# Patient Record
Sex: Male | Born: 1960 | Race: Black or African American | Hispanic: No | Marital: Married | Smoking: Never smoker
Health system: Southern US, Community
[De-identification: ages and names within clinical notes are randomized; demographics above are authoritative.]

## PROBLEM LIST (undated history)

## (undated) DIAGNOSIS — S92919A Unspecified fracture of unspecified toe(s), initial encounter for closed fracture: Secondary | ICD-10-CM

## (undated) DIAGNOSIS — R51 Headache: Secondary | ICD-10-CM

## (undated) DIAGNOSIS — M549 Dorsalgia, unspecified: Secondary | ICD-10-CM

## (undated) DIAGNOSIS — M1990 Unspecified osteoarthritis, unspecified site: Secondary | ICD-10-CM

---

## 1997-06-30 HISTORY — PX: BACK SURGERY: SHX140

## 2000-03-06 ENCOUNTER — Emergency Department (HOSPITAL_COMMUNITY): Admission: EM | Admit: 2000-03-06 | Discharge: 2000-03-06 | Payer: Self-pay | Admitting: Emergency Medicine

## 2005-08-08 ENCOUNTER — Emergency Department (HOSPITAL_COMMUNITY): Admission: EM | Admit: 2005-08-08 | Discharge: 2005-08-08 | Payer: Self-pay | Admitting: Emergency Medicine

## 2005-08-16 ENCOUNTER — Ambulatory Visit (HOSPITAL_COMMUNITY): Admission: RE | Admit: 2005-08-16 | Discharge: 2005-08-16 | Payer: Self-pay | Admitting: Specialist

## 2006-11-22 ENCOUNTER — Emergency Department (HOSPITAL_COMMUNITY): Admission: EM | Admit: 2006-11-22 | Discharge: 2006-11-22 | Payer: Self-pay | Admitting: Emergency Medicine

## 2006-11-23 ENCOUNTER — Emergency Department (HOSPITAL_COMMUNITY): Admission: EM | Admit: 2006-11-23 | Discharge: 2006-11-23 | Payer: Self-pay | Admitting: Emergency Medicine

## 2007-07-07 ENCOUNTER — Ambulatory Visit (HOSPITAL_COMMUNITY): Admission: RE | Admit: 2007-07-07 | Discharge: 2007-07-08 | Payer: Self-pay | Admitting: Neurosurgery

## 2010-05-07 ENCOUNTER — Encounter (INDEPENDENT_AMBULATORY_CARE_PROVIDER_SITE_OTHER): Payer: Self-pay | Admitting: *Deleted

## 2010-07-30 NOTE — Letter (Signed)
Summary: LEC Referral (unable to schedule) Notification  [facility]  [address] [place], [place] [address]   Phone: [phone]  Fax: [phone]      May 07, 2010 [name], December 02, 1960 MRN: [id number]   [facility] [address] [place], [place]  [address]   Dear Dr. Perrin Maltese:   Thank you for your kind referral of the above patient. We have attempted to schedule the recommended COLONOSCOPY but have been unable to schedule because:  _X_ The patient was not available by phone and/or has not returned our calls.  __ The patient declined to schedule the procedure at this time.  We appreciate the referral and hope that we will have the opportunity to treat this patient in the future.    Sincerely,   [facility]  Vania Rea. Jarold Motto M.D. Hedwig Morton. Juanda Chance M.D. Venita Lick. Russella Dar M.D. Wilhemina Bonito. Marina Goodell M.D. Barbette Hair. Arlyce Dice M.D. Iva Boop M.D. Cheron Every.D.

## 2010-10-03 ENCOUNTER — Emergency Department (HOSPITAL_COMMUNITY)
Admission: EM | Admit: 2010-10-03 | Discharge: 2010-10-03 | Disposition: A | Discharge status: Home / Self Care | Payer: Federal, State, Local not specified - PPO | Attending: Emergency Medicine | Admitting: Emergency Medicine

## 2010-10-03 ENCOUNTER — Emergency Department (HOSPITAL_COMMUNITY): Payer: Federal, State, Local not specified - PPO

## 2010-10-03 DIAGNOSIS — R209 Unspecified disturbances of skin sensation: Secondary | ICD-10-CM | POA: Insufficient documentation

## 2010-10-03 DIAGNOSIS — R112 Nausea with vomiting, unspecified: Secondary | ICD-10-CM | POA: Insufficient documentation

## 2010-10-03 DIAGNOSIS — H538 Other visual disturbances: Secondary | ICD-10-CM | POA: Insufficient documentation

## 2010-10-03 DIAGNOSIS — R51 Headache: Secondary | ICD-10-CM | POA: Insufficient documentation

## 2010-10-03 MED ORDER — IOHEXOL 350 MG/ML SOLN
80.0000 mL | Freq: Once | INTRAVENOUS | Status: AC | PRN
  Administered 2010-10-03: 80 mL via INTRAVENOUS

## 2010-11-12 NOTE — Op Note (Signed)
NAME:  Rodney Wood, Rodney Wood NO.:  [id number]   MEDICAL RECORD NO.:  [id number]          PATIENT TYPE:  OIB   LOCATION:  3533                         FACILITY:  [facility]   PHYSICIAN:  Cristi Loron, M.D.DATE OF BIRTH:  September 11, 1960   DATE OF PROCEDURE:  07/07/2007  DATE OF DISCHARGE:                               OPERATIVE REPORT   BRIEF HISTORY:  The patient is a 50 year old black male who has suffered  from back and left leg pain consistent with a left L3 radiculopathy.  He  failed medical management and was worked up with a lumbar MRI, which  demonstrated that the patient had multilevel degenerative changes  including an L4-L5 spondylolisthesis, scoliosis, and a left L3-L4 far  lateral herniated disc.  I discussed the various treatment options with  the patient including surgery.  The patient has weighed the risks,  benefits, and alternatives of the surgery and decided to proceed with a  left L3-L4 far lateral microdiscectomy.   PREOPERATIVE DIAGNOSES:  Left L3-L4 herniated nucleus pulposus, L4-L5  spondylolisthesis, degenerative disc disease, lumbar radiculopathy, and  lumbago.   POSTOPERATIVE DIAGNOSES:  Left L3-L4 herniated nucleus pulposus, L4-L5  spondylolisthesis, degenerative disc disease, lumbar radiculopathy, and  lumbago.   PROCEDURE:  Left L3-L4 far lateral microdiscectomy using  microdissection.   SURGEON:  Cristi Loron, MD   ASSISTANT:  Coletta Memos, MD   ANESTHESIA:  General endotracheal.   ESTIMATED BLOOD LOSS:  50 mL.   SPECIMENS:  None.   DRAINS:  None.   COMPLICATIONS:  None.   DESCRIPTION OF PROCEDURE:  The patient was brought to the operating room  by the anesthesia team.  General endotracheal anesthesia was induced.  The patient was turned to the prone position on the Wilson frame.  His  lumbosacral region was then prepared with Betadine scrub and Betadine  solution.  Sterile drapes were applied.  I then injected the area to  be  incised with Marcaine with epinephrine solution.  I used a scalpel to  make a linear midline incision over the L3-L4 interspace.  I used  electrocautery to perform a left-sided subperiosteal dissection,  exposing the spinous process lamina of L2, L3, and L4.  We obtained  intraoperative radiograph to confirm our location.  We then inserted the  [facility] retractor for exposure and then brought the operative  microscope into the field.  Under its magnification and illumination, we  completed the microdissection/decompression.  I used a high-speed drill  to remove the lateral aspect of the left L3 pars region.  I then  dissected through the intertransverse ligament with the micro-nerve hook  and then removed the ligament with a Kerrison punch.  We then used  microdissection to dissect through the intertransverse muscle and the  adipose tissue.  We identified the exiting L3 nerve root.  We then used  microdissection to free up the nerve root, and then we palpated ventral  to the nerve root, along the exit route.  I did not encounter any large  herniated disc.  I did inspect the far lateral disc at L3-L4.  This disc  was bulging somewhat, but I did not see large holes in the annulus nor  any impending herniations/significant neural compression.  I then shrunk  the annulus down somewhat with the bipolar electrocautery.  We performed  a foraminotomy about the left L3 nerve root and then palpated along the  exit route of the L3 nerve root.  The nerve was well decompressed.  We  then obtained hemostasis using bipolar electrocautery.  We irrigated the  wound out with bacitracin solution and removed the retractor.  We then  reapproximated the patient's thoracolumbar fascia with interrupted #1  Vicryl suture, subcutaneous tissue with interrupted #2-0 Vicryl suture,  and the skin with Steri-Strips and Benzoin.  The wound was then coated  with bacitracin ointment.  A sterile dressing was applied.   The drapes  were removed.  The patient was subsequently returned to the supine  position and extubated by the anesthesia team and transported to the  post-anesthesia care unit in a stable condition.  All sponge,  instrument, and needle counts were correct at the end of this case.      Cristi Loron, M.D.  Electronically Signed     JDJ/MEDQ  D:  07/07/2007  T:  07/08/2007  Job:  [id number]

## 2011-03-20 LAB — CBC
HCT: 44.7
MCHC: 34.4
MCV: 90.3
Platelets: 246
RDW: 12.6

## 2011-03-20 LAB — BASIC METABOLIC PANEL
Calcium: 9.8
GFR calc non Af Amer: >60

## 2012-01-30 ENCOUNTER — Other Ambulatory Visit: Payer: Self-pay | Admitting: Neurosurgery

## 2012-02-05 ENCOUNTER — Encounter (HOSPITAL_COMMUNITY): Payer: Self-pay | Admitting: Pharmacy Technician

## 2012-02-09 ENCOUNTER — Encounter (HOSPITAL_COMMUNITY): Payer: Self-pay

## 2012-02-09 ENCOUNTER — Encounter (HOSPITAL_COMMUNITY)
Admission: RE | Admit: 2012-02-09 | Discharge: 2012-02-09 | Disposition: A | Discharge status: Home / Self Care | Payer: Federal, State, Local not specified - PPO | Source: Ambulatory Visit | Attending: Neurosurgery | Admitting: Neurosurgery

## 2012-02-09 LAB — CBC
Hemoglobin: 14.3 g/dL (ref 13.0–17.0)
MCH: 30.8 pg (ref 26.0–34.0)
RDW: 13 % (ref 11.5–15.5)
WBC: 5.6 10*3/uL (ref 4.0–10.5)

## 2012-02-09 LAB — TYPE AND SCREEN: ABO/RH(D): O POS

## 2012-02-09 NOTE — Pre-Procedure Instructions (Addendum)
20 Rodney Wood  02/09/2012   Your procedure is scheduled on:  02/23/12  Report to [facility] at 930 AM.  Call this number if you have problems the morning of surgery: [phone]   Remember:   Do not eat foodor drink:After Midnight.  .  Take these medicines the morning of surgery with A SIP OF WATER:none STOP etodolac 8/19   Do not wear jewelry, make-up or nail polish.  Do not wear lotions, powders, or perfumes. You may wear deodorant.  Do not shave 48 hours prior to surgery. Men may shave face and neck.  Do not bring valuables to the hospital.  Contacts, dentures or bridgework may not be worn into surgery.  Leave suitcase in the car. After surgery it may be brought to your room.  For patients admitted to the hospital, checkout time is 11:00 AM the day of discharge.   Patients discharged the day of surgery will not be allowed to drive home.  Name and phone number of your driver: Kendal Hymen [phone]  Special Instructions: CHG Shower Use Special Wash: 1/2 bottle night before surgery and 1/2 bottle morning of surgery.   Please read over the following fact sheets that you were given: Pain Booklet, Coughing and Deep Breathing, Blood Transfusion Information, MRSA Information and Surgical Site Infection Prevention

## 2012-02-09 NOTE — Progress Notes (Signed)
Office called to sign and release orders 

## 2012-02-11 NOTE — Progress Notes (Signed)
NOTIFIED DR Lovell Sheehan OFFICE OF ORDERS NEEDING SIGNED.

## 2012-02-13 NOTE — Progress Notes (Signed)
Spoke with Graciella Belton requesting that orders be signed. States that she will notify MD to sign orders when he return on Monday.

## 2012-02-15 ENCOUNTER — Emergency Department (INDEPENDENT_AMBULATORY_CARE_PROVIDER_SITE_OTHER): Payer: Federal, State, Local not specified - PPO

## 2012-02-15 ENCOUNTER — Encounter (HOSPITAL_COMMUNITY): Payer: Self-pay | Admitting: *Deleted

## 2012-02-15 ENCOUNTER — Emergency Department (HOSPITAL_COMMUNITY)
Admission: EM | Admit: 2012-02-15 | Discharge: 2012-02-15 | Disposition: A | Discharge status: Home / Self Care | Payer: Federal, State, Local not specified - PPO | Source: Home / Self Care

## 2012-02-15 DIAGNOSIS — M79676 Pain in unspecified toe(s): Secondary | ICD-10-CM

## 2012-02-15 DIAGNOSIS — S92919A Unspecified fracture of unspecified toe(s), initial encounter for closed fracture: Secondary | ICD-10-CM

## 2012-02-15 DIAGNOSIS — W19XXXA Unspecified fall, initial encounter: Secondary | ICD-10-CM

## 2012-02-15 DIAGNOSIS — M79609 Pain in unspecified limb: Secondary | ICD-10-CM

## 2012-02-15 DIAGNOSIS — S92501A Displaced unspecified fracture of right lesser toe(s), initial encounter for closed fracture: Secondary | ICD-10-CM

## 2012-02-15 HISTORY — DX: Dorsalgia, unspecified: M54.9

## 2012-02-15 MED ORDER — IBUPROFEN 600 MG PO TABS: 600.0000 mg | ORAL_TABLET | Freq: Four times a day (QID) | ORAL | Status: AC | PRN

## 2012-02-15 MED ORDER — TRAMADOL HCL 50 MG PO TABS: 50.0000 mg | ORAL_TABLET | Freq: Four times a day (QID) | ORAL | Status: AC | PRN

## 2012-02-15 NOTE — ED Notes (Signed)
Reports slipping going into shed yesterday while wearing flip-flops; states his right 4th toe "was sticking straight up and I had to pull it back into place".  C/O continued pain and swelling in right 4th toe and into distal dorsal foot.  Has been applying ice.

## 2012-02-15 NOTE — ED Provider Notes (Signed)
Medical screening examination/treatment/procedure(s) were performed by non-physician practitioner and as supervising physician I was immediately available for consultation/collaboration.  Leslee Home, M.D.   Reuben Likes, MD 02/15/12 2014

## 2012-02-15 NOTE — ED Provider Notes (Signed)
History     CSN: [id number]  Arrival date & time 02/15/12  1102   None     Chief Complaint  Patient presents with  . Foot Injury    (Consider location/radiation/quality/duration/timing/severity/associated sxs/prior treatment) Patient is a 51 y.o. male presenting with foot injury. The history is provided by the patient.  Foot Injury   Rodney Wood is a 51 y.o. male who sustained a right fourth toe injury yesterday. Mechanism of injury: walking up ramp in rain where flip flops, fell, hyperextension of fourth toe. Immediate symptoms: pain.  Patient reduced toe back into place.  Symptoms have been worse since that time.  Has taken [facility] powder with no significant change in pain relief.  No prior history of related problems.   Past Medical History  Diagnosis Date  . Back pain     scheduled for surgery  01/2012    Past Surgical History  Procedure Date  . Back surgery 99    No family history on file.  History  Substance Use Topics  . Smoking status: Never Smoker   . Smokeless tobacco: Not on file  . Alcohol Use: No      Review of Systems  Constitutional: Negative.   Respiratory: Negative.   Cardiovascular: Negative.   Musculoskeletal: Positive for joint swelling. Negative for myalgias, back pain, arthralgias and gait problem.    Allergies  Review of patient's allergies indicates no known allergies.  Home Medications   Current Outpatient Rx  Name Route Sig Dispense Refill  . ETODOLAC 400 MG PO TABS Oral Take 400 mg by mouth 2 (two) times daily.    . IBUPROFEN 600 MG PO TABS Oral Take 1 tablet (600 mg total) by mouth every 6 (six) hours as needed for pain. 30 tablet 1  . TRAMADOL HCL 50 MG PO TABS Oral Take 1 tablet (50 mg total) by mouth every 6 (six) hours as needed for pain. 15 tablet 0    BP 117/80  Pulse 80  Temp 98.4 F (36.9 C) (Oral)  Resp 18  SpO2 97%  Physical Exam  Nursing note and vitals reviewed. Constitutional: He is oriented to person, place,  and time. Vital signs are normal. He appears well-developed and well-nourished. He is active and cooperative.  HENT:  Head: Normocephalic.  Eyes: Conjunctivae are normal. Pupils are equal, round, and reactive to light. No scleral icterus.  Neck: Trachea normal. Neck supple.  Cardiovascular: Normal rate and regular rhythm.   Pulmonary/Chest: Effort normal and breath sounds normal.  Musculoskeletal:       Right ankle: Normal. Achilles tendon normal.       Left ankle: Normal. Achilles tendon normal.       Right foot: He exhibits tenderness and swelling. He exhibits normal range of motion, normal capillary refill and no crepitus.       Left foot: Normal.       Feet:       Bruising, tenderness and swelling to right fourth toe, sensation intact, cap refill <3 secs  Neurological: He is alert and oriented to person, place, and time. He has normal strength. No cranial nerve deficit or sensory deficit. Coordination and gait normal. GCS eye subscore is 4. GCS verbal subscore is 5. GCS motor subscore is 6.  Skin: Skin is warm and dry.  Psychiatric: He has a normal mood and affect. His speech is normal and behavior is normal. Judgment and thought content normal. Cognition and memory are normal.    ED Course  Procedures (including critical care time)  Labs Reviewed - No data to display Dg Foot Complete Right  02/15/2012  *RADIOLOGY REPORT*  Clinical Data: Larey Seat, injuring foot.  Pain in the fourth toe at the metatarsal phalangeal joint.  Soft tissue swelling and bruising.  RIGHT FOOT COMPLETE - 3+ VIEW  Comparison: None.  Findings: There is an oblique fracture of the proximal phalanx of the fourth digit.  This is associated with soft tissue swelling. This does not involve the articular surface of the metatarsal phalangeal joint.  The fourth metatarsal and other bones are intact.  No radiopaque foreign body or soft tissue gas.  IMPRESSION: Fracture of the fourth proximal phalanx.  Original Report  Authenticated By: Patterson Hammersmith, M.D.     1. Fracture of fourth toe, right, closed   2. Fall   3. Toe pain       MDM  Buddy tape toe in office, post op shoe provided.  Take pain medication as prescribed.         Johnsie Kindred, NP 02/15/12 1304

## 2012-02-22 MED ORDER — CEFAZOLIN SODIUM-DEXTROSE 2-3 GM-% IV SOLR
2.0000 g | INTRAVENOUS | Status: AC
  Administered 2012-02-23: 2 g via INTRAVENOUS
  Filled 2012-02-22: qty 50

## 2012-02-23 ENCOUNTER — Ambulatory Visit (HOSPITAL_COMMUNITY): Payer: Federal, State, Local not specified - PPO

## 2012-02-23 ENCOUNTER — Encounter (HOSPITAL_COMMUNITY)
Admission: RE | Disposition: A | Discharge status: Home / Self Care | Payer: Self-pay | Source: Ambulatory Visit | Attending: Neurosurgery

## 2012-02-23 ENCOUNTER — Encounter (HOSPITAL_COMMUNITY): Payer: Self-pay | Admitting: Certified Registered"

## 2012-02-23 ENCOUNTER — Inpatient Hospital Stay (HOSPITAL_COMMUNITY)
Admission: RE | Admit: 2012-02-23 | Discharge: 2012-02-26 | DRG: 758 | Disposition: A | Discharge status: Home / Self Care | Payer: Federal, State, Local not specified - PPO | Source: Ambulatory Visit | Attending: Neurosurgery | Admitting: Neurosurgery

## 2012-02-23 ENCOUNTER — Ambulatory Visit (HOSPITAL_COMMUNITY): Payer: Federal, State, Local not specified - PPO | Admitting: Certified Registered"

## 2012-02-23 ENCOUNTER — Encounter (HOSPITAL_COMMUNITY): Payer: Self-pay | Admitting: *Deleted

## 2012-02-23 DIAGNOSIS — G5620 Lesion of ulnar nerve, unspecified upper limb: Secondary | ICD-10-CM | POA: Diagnosis present

## 2012-02-23 DIAGNOSIS — M4310 Spondylolisthesis, site unspecified: Secondary | ICD-10-CM | POA: Diagnosis present

## 2012-02-23 DIAGNOSIS — D1779 Benign lipomatous neoplasm of other sites: Secondary | ICD-10-CM | POA: Diagnosis present

## 2012-02-23 DIAGNOSIS — M48062 Spinal stenosis, lumbar region with neurogenic claudication: Principal | ICD-10-CM | POA: Diagnosis present

## 2012-02-23 HISTORY — DX: Unspecified fracture of unspecified toe(s), initial encounter for closed fracture: S92.919A

## 2012-02-23 HISTORY — PX: LUMBAR LAMINECTOMY/DECOMPRESSION MICRODISCECTOMY: SHX5026

## 2012-02-23 SURGERY — LUMBAR LAMINECTOMY/DECOMPRESSION MICRODISCECTOMY 2 LEVELS
Anesthesia: General | Site: Spine Lumbar | Laterality: Bilateral | Wound class: Clean

## 2012-02-23 MED ORDER — NALOXONE HCL 0.4 MG/ML IJ SOLN: 0.4000 mg | INTRAMUSCULAR | Status: DC | PRN

## 2012-02-23 MED ORDER — DIAZEPAM 5 MG PO TABS
5.0000 mg | ORAL_TABLET | Freq: Four times a day (QID) | ORAL | Status: DC | PRN
  Administered 2012-02-24 – 2012-02-26 (×5): 5 mg via ORAL
  Filled 2012-02-23 (×5): qty 1

## 2012-02-23 MED ORDER — DOCUSATE SODIUM 100 MG PO CAPS
100.0000 mg | ORAL_CAPSULE | Freq: Two times a day (BID) | ORAL | Status: DC
  Administered 2012-02-23 – 2012-02-26 (×6): 100 mg via ORAL
  Filled 2012-02-23 (×4): qty 1

## 2012-02-23 MED ORDER — ONDANSETRON HCL 4 MG/2ML IJ SOLN: 4.0000 mg | Freq: Four times a day (QID) | INTRAMUSCULAR | Status: DC | PRN

## 2012-02-23 MED ORDER — HEMOSTATIC AGENTS (NO CHARGE) OPTIME
TOPICAL | Status: DC | PRN
  Administered 2012-02-23: 1 via TOPICAL

## 2012-02-23 MED ORDER — OXYCODONE HCL 5 MG/5ML PO SOLN: 5.0000 mg | Freq: Once | ORAL | Status: DC | PRN

## 2012-02-23 MED ORDER — CEFAZOLIN SODIUM-DEXTROSE 2-3 GM-% IV SOLR
2.0000 g | Freq: Three times a day (TID) | INTRAVENOUS | Status: AC
  Administered 2012-02-23 – 2012-02-24 (×2): 2 g via INTRAVENOUS
  Filled 2012-02-23 (×3): qty 50

## 2012-02-23 MED ORDER — HYDROMORPHONE HCL PF 1 MG/ML IJ SOLN
0.2500 mg | INTRAMUSCULAR | Status: DC | PRN
  Administered 2012-02-23 (×2): 0.5 mg via INTRAVENOUS

## 2012-02-23 MED ORDER — MIDAZOLAM HCL 5 MG/5ML IJ SOLN
INTRAMUSCULAR | Status: DC | PRN
  Administered 2012-02-23: 2 mg via INTRAVENOUS

## 2012-02-23 MED ORDER — HYDROCODONE-ACETAMINOPHEN 5-325 MG PO TABS
1.0000 | ORAL_TABLET | ORAL | Status: DC | PRN
  Administered 2012-02-25 (×3): 2 via ORAL
  Filled 2012-02-23 (×3): qty 2

## 2012-02-23 MED ORDER — DROPERIDOL 2.5 MG/ML IJ SOLN: 0.6250 mg | INTRAMUSCULAR | Status: DC | PRN

## 2012-02-23 MED ORDER — OXYCODONE-ACETAMINOPHEN 5-325 MG PO TABS
1.0000 | ORAL_TABLET | ORAL | Status: DC | PRN
  Administered 2012-02-24 – 2012-02-26 (×10): 2 via ORAL
  Filled 2012-02-23 (×10): qty 2

## 2012-02-23 MED ORDER — OXYCODONE HCL 5 MG PO TABS: 5.0000 mg | ORAL_TABLET | Freq: Once | ORAL | Status: DC | PRN

## 2012-02-23 MED ORDER — ROCURONIUM BROMIDE 100 MG/10ML IV SOLN
INTRAVENOUS | Status: DC | PRN
  Administered 2012-02-23: 50 mg via INTRAVENOUS

## 2012-02-23 MED ORDER — LIDOCAINE HCL 4 % MT SOLN
OROMUCOSAL | Status: DC | PRN
  Administered 2012-02-23: 4 mL via TOPICAL

## 2012-02-23 MED ORDER — DIPHENHYDRAMINE HCL 50 MG/ML IJ SOLN: 12.5000 mg | Freq: Four times a day (QID) | INTRAMUSCULAR | Status: DC | PRN

## 2012-02-23 MED ORDER — SUFENTANIL CITRATE 50 MCG/ML IV SOLN
INTRAVENOUS | Status: DC | PRN
  Administered 2012-02-23: 20 ug via INTRAVENOUS
  Administered 2012-02-23: 30 ug via INTRAVENOUS

## 2012-02-23 MED ORDER — NEOSTIGMINE METHYLSULFATE 1 MG/ML IJ SOLN
INTRAMUSCULAR | Status: DC | PRN
  Administered 2012-02-23: 5 mg via INTRAVENOUS

## 2012-02-23 MED ORDER — 0.9 % SODIUM CHLORIDE (POUR BTL) OPTIME
TOPICAL | Status: DC | PRN
  Administered 2012-02-23: 1000 mL

## 2012-02-23 MED ORDER — BACITRACIN ZINC 500 UNIT/GM EX OINT
TOPICAL_OINTMENT | CUTANEOUS | Status: DC | PRN
  Administered 2012-02-23: 1 via TOPICAL

## 2012-02-23 MED ORDER — ACETAMINOPHEN 650 MG RE SUPP: 650.0000 mg | RECTAL | Status: DC | PRN

## 2012-02-23 MED ORDER — SODIUM CHLORIDE 0.9 % IR SOLN
Status: DC | PRN
  Administered 2012-02-23: 13:00:00

## 2012-02-23 MED ORDER — DIPHENHYDRAMINE HCL 12.5 MG/5ML PO ELIX: 12.5000 mg | ORAL_SOLUTION | Freq: Four times a day (QID) | ORAL | Status: DC | PRN

## 2012-02-23 MED ORDER — MORPHINE SULFATE (PF) 1 MG/ML IV SOLN
INTRAVENOUS | Status: AC
  Filled 2012-02-23: qty 25

## 2012-02-23 MED ORDER — PROPOFOL 10 MG/ML IV EMUL
INTRAVENOUS | Status: DC | PRN
  Administered 2012-02-23: 200 mg via INTRAVENOUS

## 2012-02-23 MED ORDER — ETODOLAC 400 MG PO TABS
400.0000 mg | ORAL_TABLET | Freq: Two times a day (BID) | ORAL | Status: DC
  Administered 2012-02-23 – 2012-02-26 (×6): 400 mg via ORAL
  Filled 2012-02-23 (×7): qty 1

## 2012-02-23 MED ORDER — BACITRACIN 50000 UNITS IM SOLR
INTRAMUSCULAR | Status: AC
  Filled 2012-02-23: qty 1

## 2012-02-23 MED ORDER — SODIUM CHLORIDE 0.9 % IV SOLN
INTRAVENOUS | Status: AC
  Filled 2012-02-23: qty 500

## 2012-02-23 MED ORDER — ONDANSETRON HCL 4 MG/2ML IJ SOLN: 4.0000 mg | INTRAMUSCULAR | Status: DC | PRN

## 2012-02-23 MED ORDER — BUPIVACAINE-EPINEPHRINE PF 0.5-1:200000 % IJ SOLN
INTRAMUSCULAR | Status: DC | PRN
  Administered 2012-02-23: 10 mL

## 2012-02-23 MED ORDER — BUPIVACAINE LIPOSOME 1.3 % IJ SUSP
20.0000 mL | INTRAMUSCULAR | Status: AC
  Filled 2012-02-23: qty 20

## 2012-02-23 MED ORDER — SODIUM CHLORIDE 0.9 % IJ SOLN: 9.0000 mL | INTRAMUSCULAR | Status: DC | PRN

## 2012-02-23 MED ORDER — GLYCOPYRROLATE 0.2 MG/ML IJ SOLN
INTRAMUSCULAR | Status: DC | PRN
  Administered 2012-02-23: .8 mg via INTRAVENOUS

## 2012-02-23 MED ORDER — ACETAMINOPHEN 325 MG PO TABS: 650.0000 mg | ORAL_TABLET | ORAL | Status: DC | PRN

## 2012-02-23 MED ORDER — PHENOL 1.4 % MT LIQD: 1.0000 | OROMUCOSAL | Status: DC | PRN

## 2012-02-23 MED ORDER — ZOLPIDEM TARTRATE 5 MG PO TABS
5.0000 mg | ORAL_TABLET | Freq: Every evening | ORAL | Status: DC | PRN
  Administered 2012-02-24 – 2012-02-25 (×2): 5 mg via ORAL
  Filled 2012-02-23 (×2): qty 1

## 2012-02-23 MED ORDER — BUPIVACAINE LIPOSOME 1.3 % IJ SUSP: INTRAMUSCULAR | Status: DC | PRN

## 2012-02-23 MED ORDER — MORPHINE SULFATE (PF) 1 MG/ML IV SOLN
INTRAVENOUS | Status: DC
  Administered 2012-02-23: 17:00:00 via INTRAVENOUS
  Administered 2012-02-24 (×2): 4.5 mg via INTRAVENOUS

## 2012-02-23 MED ORDER — MENTHOL 3 MG MT LOZG: 1.0000 | LOZENGE | OROMUCOSAL | Status: DC | PRN

## 2012-02-23 MED ORDER — LACTATED RINGERS IV SOLN
INTRAVENOUS | Status: DC | PRN
  Administered 2012-02-23 (×2): via INTRAVENOUS

## 2012-02-23 MED ORDER — THROMBIN 5000 UNITS EX KIT
PACK | CUTANEOUS | Status: DC | PRN
  Administered 2012-02-23 (×2): 5000 [IU] via TOPICAL

## 2012-02-23 MED ORDER — HYDROMORPHONE HCL PF 1 MG/ML IJ SOLN
INTRAMUSCULAR | Status: AC
  Administered 2012-02-23: 0.5 mg via INTRAVENOUS
  Filled 2012-02-23: qty 1

## 2012-02-23 MED ORDER — ONDANSETRON HCL 4 MG/2ML IJ SOLN
INTRAMUSCULAR | Status: DC | PRN
  Administered 2012-02-23: 4 mg via INTRAVENOUS

## 2012-02-23 MED ORDER — LACTATED RINGERS IV SOLN
INTRAVENOUS | Status: DC
  Administered 2012-02-23: 19:00:00 via INTRAVENOUS

## 2012-02-23 SURGICAL SUPPLY — 59 items
APL SKNCLS STERI-STRIP NONHPOA (GAUZE/BANDAGES/DRESSINGS) ×1
BAG DECANTER FOR FLEXI CONT (MISCELLANEOUS) ×2 IMPLANT
BENZOIN TINCTURE PRP APPL 2/3 (GAUZE/BANDAGES/DRESSINGS) ×2 IMPLANT
BLADE SURG ROTATE 9660 (MISCELLANEOUS) IMPLANT
BRUSH SCRUB EZ PLAIN DRY (MISCELLANEOUS) ×2 IMPLANT
BUR ACORN 6.0 (BURR) ×2 IMPLANT
BUR MATCHSTICK NEURO 3.0 LAGG (BURR) ×2 IMPLANT
CANISTER SUCTION 2500CC (MISCELLANEOUS) ×2 IMPLANT
CLOTH BEACON ORANGE TIMEOUT ST (SAFETY) ×2 IMPLANT
CONT SPEC 4OZ CLIKSEAL STRL BL (MISCELLANEOUS) ×2 IMPLANT
DRAIN JACKSON PRATT 10MM FLAT (MISCELLANEOUS) IMPLANT
DRAPE LAPAROTOMY 100X72X124 (DRAPES) ×2 IMPLANT
DRAPE MICROSCOPE LEICA (MISCELLANEOUS) ×2 IMPLANT
DRAPE POUCH INSTRU U-SHP 10X18 (DRAPES) ×2 IMPLANT
DRAPE SURG 17X23 STRL (DRAPES) ×8 IMPLANT
ELECT BLADE 4.0 EZ CLEAN MEGAD (MISCELLANEOUS) ×4
ELECT REM PT RETURN 9FT ADLT (ELECTROSURGICAL) ×2
ELECTRODE BLDE 4.0 EZ CLN MEGD (MISCELLANEOUS) ×2 IMPLANT
ELECTRODE REM PT RTRN 9FT ADLT (ELECTROSURGICAL) ×1 IMPLANT
EVACUATOR SILICONE 100CC (DRAIN) IMPLANT
EXPAREL 1.3% IMPLANT
GAUZE SPONGE 4X4 16PLY XRAY LF (GAUZE/BANDAGES/DRESSINGS) IMPLANT
GLOVE BIO SURGEON STRL SZ8 (GLOVE) ×2 IMPLANT
GLOVE BIO SURGEON STRL SZ8.5 (GLOVE) ×2 IMPLANT
GLOVE BIOGEL PI IND STRL 7.0 (GLOVE) ×2 IMPLANT
GLOVE BIOGEL PI INDICATOR 7.0 (GLOVE) ×2
GLOVE EXAM NITRILE LRG STRL (GLOVE) IMPLANT
GLOVE EXAM NITRILE MD LF STRL (GLOVE) IMPLANT
GLOVE EXAM NITRILE XL STR (GLOVE) IMPLANT
GLOVE EXAM NITRILE XS STR PU (GLOVE) IMPLANT
GLOVE INDICATOR 8.5 STRL (GLOVE) ×2 IMPLANT
GLOVE SS BIOGEL STRL SZ 8 (GLOVE) ×1 IMPLANT
GLOVE SUPERSENSE BIOGEL SZ 8 (GLOVE) ×1
GLOVE SURG SS PI 6.5 STRL IVOR (GLOVE) ×6 IMPLANT
GOWN BRE IMP SLV AUR LG STRL (GOWN DISPOSABLE) ×4 IMPLANT
GOWN BRE IMP SLV AUR XL STRL (GOWN DISPOSABLE) ×4 IMPLANT
GOWN STRL REIN 2XL LVL4 (GOWN DISPOSABLE) IMPLANT
KIT BASIN OR (CUSTOM PROCEDURE TRAY) ×2 IMPLANT
KIT ROOM TURNOVER OR (KITS) ×2 IMPLANT
NEEDLE HYPO 21X1.5 SAFETY (NEEDLE) ×2 IMPLANT
NEEDLE HYPO 22GX1.5 SAFETY (NEEDLE) ×2 IMPLANT
NS IRRIG 1000ML POUR BTL (IV SOLUTION) ×2 IMPLANT
PACK LAMINECTOMY NEURO (CUSTOM PROCEDURE TRAY) ×2 IMPLANT
PAD ARMBOARD 7.5X6 YLW CONV (MISCELLANEOUS) ×6 IMPLANT
PATTIES SURGICAL .5 X.5 (GAUZE/BANDAGES/DRESSINGS) ×2 IMPLANT
PATTIES SURGICAL .5 X1 (DISPOSABLE) IMPLANT
PATTIES SURGICAL 1X1 (DISPOSABLE) ×2 IMPLANT
RUBBERBAND STERILE (MISCELLANEOUS) ×4 IMPLANT
SPONGE GAUZE 4X4 12PLY (GAUZE/BANDAGES/DRESSINGS) ×2 IMPLANT
SPONGE SURGIFOAM ABS GEL SZ50 (HEMOSTASIS) ×2 IMPLANT
STRIP CLOSURE SKIN 1/2X4 (GAUZE/BANDAGES/DRESSINGS) ×2 IMPLANT
SUT VIC AB 1 CT1 18XBRD ANBCTR (SUTURE) ×3 IMPLANT
SUT VIC AB 1 CT1 8-18 (SUTURE) ×6
SUT VIC AB 2-0 CP2 18 (SUTURE) ×6 IMPLANT
SYR 20CC LL (SYRINGE) ×2 IMPLANT
SYR 20ML ECCENTRIC (SYRINGE) ×2 IMPLANT
TOWEL OR 17X24 6PK STRL BLUE (TOWEL DISPOSABLE) ×2 IMPLANT
TOWEL OR 17X26 10 PK STRL BLUE (TOWEL DISPOSABLE) ×2 IMPLANT
WATER STERILE IRR 1000ML POUR (IV SOLUTION) ×2 IMPLANT

## 2012-02-23 NOTE — Transfer of Care (Signed)
Immediate Anesthesia Transfer of Care Note  Patient: Rodney Wood  Procedure(s) Performed: Procedure(s) (LRB): LUMBAR LAMINECTOMY/DECOMPRESSION MICRODISCECTOMY 2 LEVELS (Bilateral)  Patient Location: PACU  Anesthesia Type: General  Level of Consciousness: awake, alert  and patient cooperative  Airway & Oxygen Therapy: Patient Spontanous Breathing and Patient connected to face mask oxygen  Post-op Assessment: Report given to PACU RN  Post vital signs: Reviewed and stable  Complications: No apparent anesthesia complications

## 2012-02-23 NOTE — Op Note (Signed)
Brief history: The patient is a 51 year old black male who suffers from back buttocks and leg pain consistent with neurogenic claudication. He has failed medical management and was worked up with a lumbar MRI. This demonstrated patient had spinal stenosis and epidural lipomatosis at L3-4, L4-5 and L5-S1. He also had a spondylolisthesis at L4-5. I discussed the various treatment options with patient including surgery. The patient has weighed the risks, benefits, and alternatives surgery decided proceed with a decompressive lumbar laminectomy.  Preoperative diagnosis: L3-4, L4-5 and L5-S1 spinal stenosis, epidural lipomatosis, L4-5 spondylolisthesis, lumbar radiculopathy, lumbago  Postoperative diagnosis: The same  Procedure: L4 and L5 laminectomy with bilateral L3 laminotomies to decompress the bilateral L3, L4, L5, and S1 nerve roots   Surgeon: Dr. Delma Officer  Asst.: Dr. Jillyn Hidden cram  Anesthesia: Gen. endotracheal  Estimated blood loss: 200 cc  Drains: None  Complications: None  Description of procedure: The patient was brought to the operating room by the anesthesia team. General endotracheal anesthesia was induced. The patient was turned to the prone position on the Wilson frame. The patient's lumbosacral region was then prepared with Betadine scrub and Betadine solution. Sterile drapes were applied.  I then injected the area to be incised with Marcaine with epinephrine solution. I then used a scalpel to make a linear midline incision over the L3-4, L4-5 and L5-S1 intervertebral disc space. I then used electrocautery to perform a bilateral  subperiosteal dissection exposing the spinous process and lamina of L3, L4, L5 and the upper sacrum. We obtained intraoperative radiograph to confirm our location. I then inserted the [facility] retractor for exposure.   I used a high-speed drill to perform a laminotomy at L3, L4, and L5 bilaterally. I then used a Kerrison punches to complete the  laminectomy at L4 and L5 as well as to widen the laminotomy bilaterally at L3 and removed the ligamentum flavum at L3-4, L4-5 and L5-S1. We then used microdissection to free up the thecal sac and the bilateral L3, L4-L5 and S1 nerve root from the epidural tissue. We encountered quite a bit of epidural lipomatosis which removed with the pituitary forceps as well as a Kerrison punches. I then used a Kerrison punch to perform a foraminotomy at about the bilateral L3, L4, L5 and S1 nerve root. We then using the nerve root retractor to gently retract the thecal sac and the nerve roots medially, we inspected the intervertebral disc at L3-4, L4-5 and L5-S1. There was no significant disc herniations..   I then palpated along the ventral surface of the thecal sac and along exit route of the bilateral L3, L4, L5 and S1 nerve root and noted that the neural structures were well decompressed. This completed the decompression.  We then obtained hemostasis using bipolar electrocautery. We irrigated the wound out with bacitracin solution. We then removed the retractor. We then reapproximated the patient's thoracolumbar fascia with interrupted #1 Vicryl suture. We then reapproximated the patient's subcutaneous tissue with interrupted 3-0 Vicryl suture. We then reapproximated patient's skin with Steri-Strips and benzoin. The was then coated with bacitracin ointment. The drapes were removed. The patient was subsequently returned to the supine position where they were extubated by the anesthesia team. The patient was then transported to the postanesthesia care unit in stable condition. All sponge instrument and needle counts were correct at the end of this case.

## 2012-02-23 NOTE — H&P (Signed)
Subjective: The patient is a 51 year old black male who has suffered from back buttocks and leg pain consistent with neurogenic claudication. The patient has failed medical management and was worked up with a lumbar MRI. This demonstrated severe multifactorial spinal stenosis at L3-4 L4-5 and L5-S1. I discussed the various treatment options with the patient including a decompressive laminectomy. I described the surgery to him. The patient has weighed the risks, benefits and alternatives surgery and decided proceed with at L3-L5 laminectomy.   Past Medical History  Diagnosis Date  . Back pain     scheduled for surgery  01/2012  . Broken toe     broke last week    Past Surgical History  Procedure Date  . Back surgery 99    No Known Allergies  History  Substance Use Topics  . Smoking status: Never Smoker   . Smokeless tobacco: Not on file  . Alcohol Use: No    No family history on file. Prior to Admission medications   Medication Sig Start Date End Date Taking? Authorizing Provider  etodolac (LODINE) 400 MG tablet Take 400 mg by mouth 2 (two) times daily.   Yes Historical Provider, MD  ibuprofen (ADVIL,MOTRIN) 600 MG tablet Take 1 tablet (600 mg total) by mouth every 6 (six) hours as needed for pain. 02/15/12 02/25/12 Yes Johnsie Kindred, NP  traMADol (ULTRAM) 50 MG tablet Take 1 tablet (50 mg total) by mouth every 6 (six) hours as needed for pain. 02/15/12 02/25/12 Yes Johnsie Kindred, NP     Review of Systems  Positive ROS: As above  All other systems have been reviewed and were otherwise negative with the exception of those mentioned in the HPI and as above.  Objective: Vital signs in last 24 hours: Temp:  [98.5 F (36.9 C)] 98.5 F (36.9 C) (08/26 0932) Pulse Rate:  [85] 85  (08/26 0932) Resp:  [18] 18  (08/26 0932) BP: (137)/(92) 137/92 mmHg (08/26 0932) SpO2:  [94 %] 94 % (08/26 0932)  General Appearance: Alert, cooperative, no distress, appears stated age Head:  Normocephalic, without obvious abnormality, atraumatic Eyes: PERRL, conjunctiva/corneas clear, EOM's intact, fundi benign, both eyes      Ears: Normal TM's and external ear canals, both ears Throat: Lips, mucosa, and tongue normal; teeth and gums normal Neck: Supple, symmetrical, trachea midline, no adenopathy; thyroid: No enlargement/tenderness/nodules; no carotid bruit or JVD Back: Symmetric, no curvature, ROM normal, no CVA tenderness Lungs: Clear to auscultation bilaterally, respirations unlabored Heart: Regular rate and rhythm, S1 and S2 normal, no murmur, rub or gallop Abdomen: Soft, non-tender, bowel sounds active all four quadrants, no masses, no organomegaly Extremities: Extremities normal, atraumatic, no cyanosis or edema Pulses: 2+ and symmetric all extremities Skin: Skin color, texture, turgor normal, no rashes or lesions  NEUROLOGIC:   Mental status: alert and oriented, no aphasia, good attention span, Fund of knowledge/ memory ok Motor Exam - grossly normal Sensory Exam - grossly normal Reflexes: Symmetric Coordination - grossly normal Gait - grossly normal Balance - grossly normal Cranial Nerves: I: smell Not tested  II: visual acuity  OS: Normal    OD: Normal   II: visual fields Full to confrontation  II: pupils Equal, round, reactive to light  III,VII: ptosis None  III,IV,VI: extraocular muscles  Full ROM  V: mastication Normal  V: facial light touch sensation  Normal  V,VII: corneal reflex  Present  VII: facial muscle function - upper  Normal  VII: facial muscle function - lower  Normal  VIII: hearing Not tested  IX: soft palate elevation  Normal  IX,X: gag reflex Present  XI: trapezius strength  5/5  XI: sternocleidomastoid strength 5/5  XI: neck flexion strength  5/5  XII: tongue strength  Normal    Data Review Lab Results  Component Value Date   WBC 5.6 02/09/2012   HGB 14.3 02/09/2012   HCT 42.3 02/09/2012   MCV 91.2 02/09/2012   PLT 200 02/09/2012    Lab Results  Component Value Date   NA 138 07/05/2007   K 4.0 07/05/2007   CL 105 07/05/2007   CO2 26 07/05/2007   BUN 11 07/05/2007   CREATININE 0.92 07/05/2007   GLUCOSE 95 07/05/2007   No results found for this basename: INR, PROTIME    Assessment/Plan: L3-4, L4-5 and L5-S1 spinal stenosis, lumbar radiculopathy, neurogenic claudication, lumbago: I discussed situation with the patient. I reviewed his MR scan with them and pointed out the abnormalities. We have discussed the various treatment options including surgery. I described the surgical option of an L3-L5 laminectomy. I've shown surgical models. We have discussed the risks, benefits, alternatives and likelihood of achieving our goals with surgery. I have answered all the patient's questions. He has decided to proceed with surgery.   Branna Cortina D 02/23/2012 12:09 PM

## 2012-02-23 NOTE — Anesthesia Procedure Notes (Signed)
Procedure Name: Intubation Date/Time: 02/23/2012 12:41 PM Performed by: Glendora Score A Pre-anesthesia Checklist: Emergency Drugs available, Patient identified, Suction available and Patient being monitored Patient Re-evaluated:Patient Re-evaluated prior to inductionOxygen Delivery Method: Circle system utilized Preoxygenation: Pre-oxygenation with 100% oxygen Intubation Type: IV induction Ventilation: Mask ventilation without difficulty and Oral airway inserted - appropriate to patient size Laryngoscope Size: Hyacinth Meeker and 2 Grade View: Grade I Tube type: Oral Tube size: 7.5 mm Number of attempts: 1 Airway Equipment and Method: Stylet and LTA kit utilized Placement Confirmation: ETT inserted through vocal cords under direct vision,  breath sounds checked- equal and bilateral and positive ETCO2 Secured at: 22 cm Tube secured with: Tape Dental Injury: Teeth and Oropharynx as per pre-operative assessment

## 2012-02-23 NOTE — Anesthesia Postprocedure Evaluation (Signed)
  Anesthesia Post-op Note  Patient: Rodney Wood  Procedure(s) Performed: Procedure(s) (LRB): LUMBAR LAMINECTOMY/DECOMPRESSION MICRODISCECTOMY 2 LEVELS (Bilateral)  Patient Location: PACU  Anesthesia Type: General  Level of Consciousness: awake, alert  and oriented  Airway and Oxygen Therapy: Patient Spontanous Breathing and Patient connected to nasal cannula oxygen  Post-op Pain: mild  Post-op Assessment: Post-op Vital signs reviewed  Post-op Vital Signs: Reviewed  Complications: No apparent anesthesia complications

## 2012-02-23 NOTE — Anesthesia Preprocedure Evaluation (Signed)
Anesthesia Evaluation  Patient identified by MRN, date of birth, ID band Patient awake    Reviewed: Allergy & Precautions, H&P , NPO status , Patient's Chart, lab work & pertinent test results  Airway Mallampati: I TM Distance: >3 FB Neck ROM: full    Dental  (+) Teeth Intact and Dental Advidsory Given   Pulmonary neg pulmonary ROS,    Pulmonary exam normal       Cardiovascular Rhythm:regular Rate:Normal     Neuro/Psych    GI/Hepatic negative GI ROS, Neg liver ROS,   Endo/Other  negative endocrine ROS  Renal/GU negative Renal ROS     Musculoskeletal   Abdominal Normal abdominal exam  (+)   Peds  Hematology   Anesthesia Other Findings   Reproductive/Obstetrics                           Anesthesia Physical Anesthesia Plan  ASA: II  Anesthesia Plan: General   Post-op Pain Management:    Induction:   Airway Management Planned:   Additional Equipment:   Intra-op Plan:   Post-operative Plan:   Informed Consent: I have reviewed the patients History and Physical, chart, labs and discussed the procedure including the risks, benefits and alternatives for the proposed anesthesia with the patient or authorized representative who has indicated his/her understanding and acceptance.   Dental Advisory Given  Plan Discussed with: Anesthesiologist, CRNA and Surgeon  Anesthesia Plan Comments:         Anesthesia Quick Evaluation

## 2012-02-23 NOTE — Preoperative (Signed)
Beta Blockers   Reason not to administer Beta Blockers:Not Applicable 

## 2012-02-24 ENCOUNTER — Encounter (HOSPITAL_COMMUNITY): Payer: Self-pay

## 2012-02-24 LAB — CBC
Platelets: 185 10*3/uL (ref 150–400)
RBC: 4.06 MIL/uL — ABNORMAL LOW (ref 4.22–5.81)
WBC: 9 10*3/uL (ref 4.0–10.5)

## 2012-02-24 MED ORDER — MORPHINE SULFATE 2 MG/ML IJ SOLN: 2.0000 mg | INTRAMUSCULAR | Status: DC | PRN

## 2012-02-24 NOTE — Evaluation (Signed)
Occupational Therapy Evaluation Patient Details Name: Rodney Wood MRN: [id number] DOB: [birth year] Today's Date: 02/24/2012 Time: 4098-1191 OT Time Calculation (min): 20 min  OT Assessment / Plan / Recommendation Clinical Impression  PT s/p back surgery with back precautions, increased dizziness since surgery, pain and decreased activity tolerance.  Pt would benefit from cont OT to increase I with basic adls and teach pt about use of adaptive equipment.    OT Assessment  Patient needs continued OT Services    Follow Up Recommendations  No OT follow up    Barriers to Discharge None    Equipment Recommendations  3 in 1 bedside comode    Recommendations for Other Services    Frequency  Min 2X/week    Precautions / Restrictions Precautions Precautions: Back Precaution Booklet Issued: Yes (comment) Precaution Comments: Pt given back precautions handout and educated about log rolling for bed mobility. Restrictions Weight Bearing Restrictions: No Other Position/Activity Restrictions: Pt dizzy.  Move from supine to sit to stand slowly   Pertinent Vitals/Pain Pt with pain level of 8/10.  Nurse gave pt pain meds prior to therapy.    ADL  Eating/Feeding: Performed;Independent Where Assessed - Eating/Feeding: Chair Grooming: Performed;Set up Where Assessed - Grooming: Supported sitting;Other (comment) (secondary to dizziness in standing.) Upper Body Bathing: Simulated;Set up Where Assessed - Upper Body Bathing: Supported sitting Lower Body Bathing: Moderate assistance;Simulated Where Assessed - Lower Body Bathing: Supported sit to stand Upper Body Dressing: Performed;Set up Where Assessed - Upper Body Dressing: Supported sitting Lower Body Dressing: Performed;Maximal assistance Where Assessed - Lower Body Dressing: Supported sit to stand Toilet Transfer: Performed;Minimal Dentist Method: Surveyor, minerals: Environmental education officer and Hygiene: Simulated;Minimal assistance Where Assessed - Engineer, mining and Hygiene: Sit to stand from 3-in-1 or toilet Equipment Used: Rolling walker Transfers/Ambulation Related to ADLs: Pt became very dizzy when standing therefore walking was deferred.  Pt transferred with min assist. ADL Comments: Dizziness limited most adls.  Pt with more difficulty with LE adls.  Pt would benefit from AE education.    OT Diagnosis: Generalized weakness;Acute pain  OT Problem List: Decreased activity tolerance;Decreased knowledge of use of DME or AE;Pain OT Treatment Interventions: Self-care/ADL training;DME and/or AE instruction   OT Goals Acute Rehab OT Goals OT Goal Formulation: With patient/family Time For Goal Achievement: 03/09/12 Potential to Achieve Goals: Good ADL Goals Pt Will Perform Grooming: with supervision;Standing at sink ADL Goal: Grooming - Progress: Goal set today Pt Will Perform Lower Body Bathing: with supervision;Sit to stand from chair;Standing at sink;Sitting at sink;with adaptive equipment ADL Goal: Lower Body Bathing - Progress: Goal set today Pt Will Perform Lower Body Dressing: with supervision;with adaptive equipment;Sit to stand from chair ADL Goal: Lower Body Dressing - Progress: Goal set today Pt Will Perform Tub/Shower Transfer: Tub transfer;with supervision ADL Goal: Tub/Shower Transfer - Progress: Goal set today Additional ADL Goal #1: Pt will complete all aspects of toileting with 3:1 over commode with S. ADL Goal: Additional Goal #1 - Progress: Goal set today  Visit Information  Last OT Received On: 02/24/12 Assistance Needed: +1    Subjective Data  Subjective: "I just get so dizzy." Patient Stated Goal: to go home.   Prior Functioning  Vision/Perception  Home Living Lives With: Spouse Available Help at Discharge: Family;Available 24 hours/day Type of Home: House Home Access: Stairs to enter [employer] of Steps: 3 Entrance Stairs-Rails: None Home Layout: Two level;Able to live on main  level with bedroom/bathroom;Bed/bath upstairs Alternate Level Stairs-Number of Steps: 10 Alternate Level Stairs-Rails: Right Bathroom Shower/Tub: Tub/shower unit;Curtain Bathroom Toilet: Standard Home Adaptive Equipment: None Additional Comments: half bath downstairs, full bath upstairs Prior Function Level of Independence: Independent Able to Take Stairs?: Yes Driving: Yes Vocation: On disability Communication Communication: No difficulties Dominant Hand: Right   Vision - Assessment Vision Assessment: Vision not tested  Cognition  Overall Cognitive Status: Appears within functional limits for tasks assessed/performed Arousal/Alertness: Awake/alert Orientation Level: Appears intact for tasks assessed Behavior During Session: [facility] for tasks performed    Extremity/Trunk Assessment Right Upper Extremity Assessment RUE ROM/Strength/Tone: [facility] for tasks assessed RUE Sensation: Deficits RUE Sensation Deficits: Pt reports tingling "like lots of bees stinging me" in hand up to elbow that was also present prior to surgery. RUE Coordination: WFL - gross/fine motor Left Upper Extremity Assessment LUE ROM/Strength/Tone: WFL for tasks assessed LUE Sensation: WFL - Light Touch LUE Coordination: WFL - gross/fine motor Right Lower Extremity Assessment RLE ROM/Strength/Tone: WFL for tasks assessed Left Lower Extremity Assessment LLE ROM/Strength/Tone: WFL for tasks assessed Trunk Assessment Trunk Assessment: Normal   Mobility Bed Mobility Bed Mobility: Rolling Right;Supine to Sit Rolling Right: 5: Supervision Rolling Left: 4: Min assist Left Sidelying to Sit: 4: Min assist Supine to Sit: 4: Min assist Details for Bed Mobility Assistance: Pt required verbal cueing for log roll sequencing and facilitation at hip and upper trunk during rolling left to maintain log rolling  position. Transfers Transfers: Sit to Stand;Stand to Sit Sit to Stand: 4: Min assist Stand to Sit: 4: Min assist Details for Transfer Assistance: Pt required verbal cueing to not hold his breath during transfers. Pt requried facilitation at pelvis and upper trunk to come to full upright stance and verbal/tactile cueing to slow descent when stand ->sit. Pt became dizzy when sitting up EOB and again when standing but dizziness resolved after <1 minute.   Exercise    Balance Balance Balance Assessed: No  End of Session OT - End of Session Activity Tolerance: Patient limited by pain Patient left: in chair;with call bell/phone within reach;with family/visitor present Nurse Communication: Mobility status  GO     Hope Budds 02/24/2012, 1:31 PM [phone]

## 2012-02-24 NOTE — Evaluation (Signed)
Physical Therapy Evaluation Patient Details Name: Rodney Wood MRN: [id number] DOB: 1960-07-30 Today's Date: 02/24/2012 Time: 4098-1191 PT Time Calculation (min): 17 min  PT Assessment / Plan / Recommendation Clinical Impression  Pt is a 65 yp male s/p L4 and L5 laminectomy with bilateral L3 laminotomies to decompress the bilateral L3, L4, L5, and S1 nerve roots. Pt presents to physical therapy evaluation with decreased mobility secpmdary to surgery and pain. Pt will benefit from skilled physical therapy in the acute care setting to address these issues prior to discharge. Pt's evaluation limited by pt complaints of dizziness. Recommending HHPT follow up  (pending pt progress).    PT Assessment  Patient needs continued PT services    Follow Up Recommendations  Home health PT;Supervision for mobility/OOB    Barriers to Discharge Inaccessible home environment bedroom and shower on second level    Equipment Recommendations  Rolling walker with 5" wheels    Recommendations for Other Services     Frequency Min 5X/week    Precautions / Restrictions Precautions Precautions: Back Precaution Booklet Issued: Yes (comment) Precaution Comments: Pt given back precautions handout and educated about log rolling for bed mobility. Restrictions Weight Bearing Restrictions: No   Pertinent Vitals/Pain Pt c/o 10/10 pain with mobility. Nursing was notified and administered pain meds.      Mobility  Bed Mobility Bed Mobility: Rolling Left;Left Sidelying to Sit Rolling Left: 4: Min assist Left Sidelying to Sit: 4: Min assist Details for Bed Mobility Assistance: Pt required verbal cueing for log roll sequencing and facilitation at hip and upper trunk during rolling left to maintain log rolling position. Transfers Transfers: Sit to Stand;Stand to Sit Sit to Stand: 4: Min assist Stand to Sit: 4: Min assist Details for Transfer Assistance: Pt required verbal cueing to not hold his breath during  transfers. Pt requried facilitation at pelvis and upper trunk to come to full upright stance and verbal/tactile cueing to slow descent when stand ->sit. Pt became dizzy when sitting up EOB and again when standing but dizziness resolved after <1 minute. Ambulation/Gait Ambulation/Gait Assistance: 4: Min assist Ambulation Distance (Feet): 40 Feet Assistive device: Rolling walker Ambulation/Gait Assistance Details: Pt required verbal cueing for upright posture and for relaxing shoulders and bil UE. Pt required one rest break during ambulation due to dizziness and distance was limited due to pt's dizziness. Gait Pattern: Step-through pattern;Decreased stride length;Shuffle;Trunk flexed;Narrow base of support Gait velocity: decreased gait speed General Gait Details: decreased step height bil Stairs: No    Exercises     PT Diagnosis: Difficulty walking;Acute pain  PT Problem List: Decreased activity tolerance;Decreased mobility;Decreased balance;Decreased knowledge of use of DME;Decreased knowledge of precautions PT Treatment Interventions: DME instruction;Gait training;Stair training;Functional mobility training;Therapeutic activities;Therapeutic exercise;Balance training;Neuromuscular re-education;Patient/family education   PT Goals Acute Rehab PT Goals PT Goal Formulation: With patient Time For Goal Achievement: 03/02/12 Potential to Achieve Goals: Good Pt will Roll Supine to Right Side: with modified independence PT Goal: Rolling Supine to Right Side - Progress: Goal set today Pt will Roll Supine to Left Side: with modified independence PT Goal: Rolling Supine to Left Side - Progress: Goal set today Pt will go Supine/Side to Sit: with modified independence PT Goal: Supine/Side to Sit - Progress: Goal set today Pt will go Sit to Stand: with modified independence PT Goal: Sit to Stand - Progress: Goal set today Pt will go Stand to Sit: with modified independence PT Goal: Stand to Sit -  Progress: Goal set today Pt will Ambulate: >  150 feet;with modified independence;with least restrictive assistive device PT Goal: Ambulate - Progress: Goal set today Pt will Go Up / Down Stairs: Flight;with supervision;with rail(s) PT Goal: Up/Down Stairs - Progress: Goal set today Additional Goals Additional Goal #1: Pt will be able to verbalize 3/3 back precautions and demonstrate understanding dueing functional mobility. PT Goal: Additional Goal #1 - Progress: Goal set today  Visit Information  Last PT Received On: 02/24/12 Assistance Needed: +2 (for chair follow secondary to pt dizziness)    Subjective Data  Subjective: "I am sore."   Prior Functioning  Home Living Lives With: Spouse Available Help at Discharge: Family;Available 24 hours/day Type of Home: House Home Access: Stairs to enter [employer] of Steps: 3 Entrance Stairs-Rails: None Home Layout: Two level;Able to live on main level with bedroom/bathroom;Bed/bath upstairs (will sleep on the couch downstairs) Alternate Level Stairs-Number of Steps: 10 Alternate Level Stairs-Rails: Right Bathroom Shower/Tub: Engineer, manufacturing systems: Standard Home Adaptive Equipment: None Additional Comments: half bath downstairs, full bath upstairs Prior Function Level of Independence: Independent Able to Take Stairs?: Yes Driving: Yes Vocation: On disability Comments: took his time because of pain Communication Communication: No difficulties    Cognition  Overall Cognitive Status: Appears within functional limits for tasks assessed/performed Arousal/Alertness: Awake/alert Orientation Level: Appears intact for tasks assessed Behavior During Session: [facility] for tasks performed    Extremity/Trunk Assessment Right Upper Extremity Assessment RUE ROM/Strength/Tone: [facility] for tasks assessed RUE Sensation: Deficits RUE Sensation Deficits: Pt reports tingling "like lots of bees stinging me" in hand up to elbow that was  also present prior to surgery. Left Upper Extremity Assessment LUE ROM/Strength/Tone: [facility] for tasks assessed Right Lower Extremity Assessment RLE ROM/Strength/Tone: [facility] for tasks assessed Left Lower Extremity Assessment LLE ROM/Strength/Tone: [facility] for tasks assessed Trunk Assessment Trunk Assessment: Normal   Balance    End of Session PT - End of Session Equipment Utilized During Treatment: Gait belt Activity Tolerance: Treatment limited secondary to medical complications (Comment) (dizziness) Patient left: in chair;with call bell/phone within reach Nurse Communication: Mobility status;Patient requests pain meds  GP     Eirik Schueler 02/24/2012, 9:53 AM

## 2012-02-24 NOTE — Progress Notes (Signed)
Patient did walk to the bathroom early morning, patient was having tingling on Rt arm and was dizzi. Tolerated fair.

## 2012-02-24 NOTE — Evaluation (Signed)
I have read and agree with the below assessment and treatment plan.  Kalina Morabito Helen Whitlow PT, DPT Pager: [phone] 

## 2012-02-24 NOTE — Progress Notes (Signed)
Patient ID: Rodney Wood, male   DOB: Dec 25, 1960, 51 y.o.   MRN: [id number] Subjective:  The patient is alert and pleasant. He looks well.  Objective: Vital signs in last 24 hours: Temp:  [97 F (36.1 C)-98.7 F (37.1 C)] 98.7 F (37.1 C) (08/27 0030) Pulse Rate:  [67-85] 85  (08/27 0030) Resp:  [14-24] 16  (08/27 0450) BP: (129-143)/(65-92) 143/65 mmHg (08/27 0030) SpO2:  [94 %-100 %] 98 % (08/27 0450) Weight:  [88.9 kg (195 lb 15.8 oz)] 88.9 kg (195 lb 15.8 oz) (08/26 1731)  Intake/Output from previous day: 08/26 0701 - 08/27 0700 In: 1600 [I.V.:1600] Out: 410 [Urine:210; Blood:200] Intake/Output this shift:    Physical exam the patient is alert and oriented. His strength is normal his bilateral gastrocnemius and dorsiflexors.  Lab Results: No results found for this basename: WBC:2,HGB:2,HCT:2,PLT:2 in the last 72 hours BMET No results found for this basename: NA:2,K:2,CL:2,CO2:2,GLUCOSE:2,BUN:2,CREATININE:2,CALCIUM:2 in the last 72 hours  Studies/Results: Dg Lumbar Spine 1 View  02/23/2012  *RADIOLOGY REPORT*  Clinical Data: 51 year old male undergoing lumbar surgery.  LUMBAR SPINE - 1 VIEW  Comparison: [facility] lumbar radiographs 12/16/2011. Lumbar MRI 11/06/2011.  Findings: Normal lumbar segmentation on comparison.  Portable cross-table lateral intraoperative view of the lumbar spine labeled film #1 at 1325 hours.  Surgical probe directed at the L5 pedicle level.  IMPRESSION: Intraoperative localization at L5.   Original Report Authenticated By: Harley Hallmark, M.D.     Assessment/Plan: Postop day #1: The patient is doing well. I will DC his PCA pump and Hep-Lock his IV. He will likely go home tomorrow.  LOS: 1 day     Demiya Magno D 02/24/2012, 7:08 AM

## 2012-02-24 NOTE — Care Management Note (Signed)
    Page 1 of 2   02/26/2012     3:43:15 PM   CARE MANAGEMENT NOTE 02/26/2012  Patient:  Rodney Wood, Rodney Wood   Account Number:  [id number]  Date Initiated:  02/24/2012  Documentation initiated by:  Onnie Boer  Subjective/Objective Assessment:   PT WAS ADMITTED FOR A LAMIN, DECOMPRESSION AND DISCECTOMY     Action/Plan:   PROGRESSION OF CARE AND DISCHARGE PLANNING   Anticipated DC Date:  02/26/2012   Anticipated DC Plan:  HOME W HOME HEALTH SERVICES      DC Planning Services  CM consult      Choice offered to / List presented to:  C-1 Patient   DME arranged  [facility] RO[facility]  3-N-1      DME agency  [facility]     HH arranged  HH-2 PT      [facility] agency  [facility]   Status of service:  Completed, signed off Medicare Important Message given?   (If response is "NO", the following Medicare IM given date fields will be blank) Date Medicare IM given:   Date Additional Medicare IM given:    Discharge Disposition:  HOME W HOME HEALTH SERVICES  Per UR Regulation:  Reviewed for med. necessity/level of care/duration of stay  If discussed at Long Length of Stay Meetings, dates discussed:    Comments:  02/24/12 Onnie Boer, RN, BSN 1038 PT WAS ADMITTED FOR BACK SURGERY, PTA PT WAS AT HOME WITH SELF CARE.  WILL F/U WITH DC NEEDS AND RECOMMENDATIONS.

## 2012-02-25 MED ORDER — GABAPENTIN 300 MG PO CAPS
300.0000 mg | ORAL_CAPSULE | Freq: Two times a day (BID) | ORAL | Status: DC
  Administered 2012-02-25 – 2012-02-26 (×3): 300 mg via ORAL
  Filled 2012-02-25 (×4): qty 1

## 2012-02-25 NOTE — Progress Notes (Signed)
Occupational Therapy Treatment Patient Details Name: Rodney Wood MRN: [id number] DOB: [birth year] Today's Date: 02/25/2012 Time: 9562-1308 OT Time Calculation (min): 16 min  OT Assessment / Plan / Recommendation Comments on Treatment Session Focus of session on AE education.  Pt limited by pain today, but will attempt tub transfer during next session.      Follow Up Recommendations  No OT follow up    Barriers to Discharge       Equipment Recommendations  Rolling walker with 5" wheels;3 in 1 bedside comode    Recommendations for Other Services    Frequency Min 2X/week   Plan Discharge plan remains appropriate    Precautions / Restrictions Precautions Precautions: Fall;Back Precaution Booklet Issued: Yes (comment) Precaution Comments: Pt able to independently recall 3/3 back precautions. Restrictions Weight Bearing Restrictions: No Other Position/Activity Restrictions: dizzy   Pertinent Vitals/Pain See vitals    ADL  Lower Body Bathing: Simulated;Supervision/safety Where Assessed - Lower Body Bathing: Unsupported sitting Lower Body Dressing: Performed;Supervision/safety Where Assessed - Lower Body Dressing: Supported standing Equipment Used: Sock aid;Reacher;Long-handled sponge Transfers/Ambulation Related to ADLs: Pt just recently returned to bed and declining OOB ambulation. ADL Comments: Discussed with pt ADL techniques to assist in maintaining back precautions.  Pt able to cross ankles over knees with some difficulty (was able to cross legs before sx) but not quite able to reach feet, would probably be able to don pants/undergarments using this technique.  Educated pt on use of reacher, sock aid, and long handled sponge.  Pt able to return demonstration of AE with supervision and min cueing for technique.  Informed pt and wife that AE is available in hospital gift shop for $25 plus tax.  Pt appreciated AE education and reported that he is unsure yet if he will shop around  in the community for AE or purchase from gift shop.     OT Diagnosis:    OT Problem List:   OT Treatment Interventions:     OT Goals ADL Goals Pt Will Perform Grooming: with supervision;Standing at sink Pt Will Perform Lower Body Bathing: with supervision;Sit to stand from chair;Standing at sink;Sitting at sink;with adaptive equipment ADL Goal: Lower Body Bathing - Progress: Met Pt Will Perform Lower Body Dressing: with supervision;with adaptive equipment;Sit to stand from chair ADL Goal: Lower Body Dressing - Progress: Met Pt Will Perform Tub/Shower Transfer: Tub transfer;with supervision Additional ADL Goal #1: Pt will complete all aspects of toileting with 3:1 over commode with S.  Visit Information  Last OT Received On: 02/25/12 Assistance Needed: +1    Subjective Data      Prior Functioning       Cognition  Overall Cognitive Status: Appears within functional limits for tasks assessed/performed Arousal/Alertness: Awake/alert Orientation Level: Appears intact for tasks assessed Behavior During Session: [facility] for tasks performed    Mobility  Shoulder Instructions Bed Mobility Bed Mobility: Rolling Left;Left Sidelying to Sit;Sitting - Scoot to Edge of Bed;Sit to Sidelying Left Rolling Left: 5: Supervision;With rail Left Sidelying to Sit: 4: Min guard;With rails;HOB elevated Sitting - Scoot to Edge of Bed: 5: Supervision Sit to Sidelying Left: 4: Min assist;With rail;HOB elevated Details for Bed Mobility Assistance: Required increased time due to pain. Cueing for technique. Transfers Transfers: Not assessed Sit to Stand: 4: Min assist;With upper extremity assist;From bed;From chair/3-in-1 Stand to Sit: 4: Min guard;With upper extremity assist;To chair/3-in-1 Details for Transfer Assistance: cues for hand placement as well as not to hold his breath during transfers, cued for  diaphragmatic breathing and relaxation as well as faclitation for tall posture; dizziness sitt EOB  which resolved quickly, dizziness standing which resolved as well, BP WFL during all transfers       Exercises      Balance     End of Session OT - End of Session Activity Tolerance: Patient limited by pain Patient left: in bed;with call bell/phone within reach;with bed alarm set;with family/visitor present  GO    02/25/2012 Cipriano Mile OTR/L Pager [phone] Office [phone]  Cipriano Mile 02/25/2012, 2:00 PM

## 2012-02-25 NOTE — Progress Notes (Signed)
Physical Therapy Treatment Patient Details Name: Rodney Wood MRN: [id number] DOB: 1960/11/07 Today's Date: 02/25/2012 Time: 7829-5621 PT Time Calculation (min): 24 min  PT Assessment / Plan / Recommendation Comments on Treatment Session  Progressing slowly,  still limited by dizziness. Orthostatics negative.     Follow Up Recommendations  Home health PT;Supervision for mobility/OOB    Barriers to Discharge        Equipment Recommendations  Rolling walker with 5" wheels;3 in 1 bedside comode    Recommendations for Other Services    Frequency Min 5X/week   Plan Discharge plan remains appropriate;Frequency remains appropriate    Precautions / Restrictions Precautions Precautions: Fall;Back Precaution Booklet Issued: Yes (comment) Restrictions Weight Bearing Restrictions: No Other Position/Activity Restrictions: dizzy       Mobility  Bed Mobility Rolling Left: 4: Min assist Left Sidelying to Sit: 4: Min assist;HOB elevated;With rails (30 degrees) Details for Bed Mobility Assistance: cues for technique and facilitation for follow through Transfers Transfers: Sit to Stand;Stand to Sit Sit to Stand: 4: Min assist;With upper extremity assist;From bed;From chair/3-in-1 Stand to Sit: 4: Min guard;With upper extremity assist;To chair/3-in-1 Details for Transfer Assistance: cues for hand placement as well as not to hold his breath during transfers, cued for diaphragmatic breathing and relaxation as well as faclitation for tall posture; dizziness sitt EOB which resolved quickly, dizziness standing which resolved as well, BP WFL during all transfers Ambulation/Gait Ambulation/Gait Assistance: 4: Min guard Ambulation Distance (Feet): 55 Feet Assistive device: Rolling walker Ambulation/Gait Assistance Details: cues for upright posture, relaxed shoulder and trunk and for slow deep breaths during gait, again dizzy requiring one seated rest break but able to ambulate further today with  encouragement Gait Pattern: Shuffle      PT Goals Acute Rehab PT Goals PT Goal: Rolling Supine to Left Side - Progress: Progressing toward goal PT Goal: Supine/Side to Sit - Progress: Progressing toward goal PT Goal: Sit to Stand - Progress: Progressing toward goal PT Goal: Stand to Sit - Progress: Progressing toward goal PT Goal: Ambulate - Progress: Progressing toward goal Additional Goals PT Goal: Additional Goal #1 - Progress: Progressing toward goal  Visit Information  Last PT Received On: 02/25/12 Assistance Needed: +1    Subjective Data  Subjective: The only time I am not painful is when I am asleep.    Cognition  Overall Cognitive Status: Appears within functional limits for tasks assessed/performed Arousal/Alertness: Awake/alert Orientation Level: Appears intact for tasks assessed Behavior During Session: [facility] for tasks performed    Balance     End of Session PT - End of Session Equipment Utilized During Treatment: Gait belt Activity Tolerance: Patient tolerated treatment well;Other (comment) (dizziness) Patient left: in chair;with call bell/phone within reach Nurse Communication: Mobility status   GP     [facility] HELEN 02/25/2012, 10:35 AM

## 2012-02-25 NOTE — Progress Notes (Signed)
Patient ID: Rodney Wood, male   DOB: 05/24/1961, 51 y.o.   MRN: [id number] Subjective:  The patient is alert and pleasant. He complains of some right ulnar numbness. He tells me he was having his problem he prior to surgery but has worsened that since surgery. His back is sore.  Objective: Vital signs in last 24 hours: Temp:  [98.1 F (36.7 C)-98.9 F (37.2 C)] 98.3 F (36.8 C) (08/28 1039) Pulse Rate:  [70-90] 80  (08/28 1039) Resp:  [16-18] 18  (08/28 1039) BP: (115-155)/(60-84) 115/80 mmHg (08/28 1039) SpO2:  [96 %-100 %] 96 % (08/28 1039)  Intake/Output from previous day:   Intake/Output this shift:    Physical exam patient is alert and oriented. His strength is grossly normal.  Lab Results:  Basename 02/24/12 0648  WBC 9.0  HGB 12.4*  HCT 37.6*  PLT 185   BMET No results found for this basename: NA:2,K:2,CL:2,CO2:2,GLUCOSE:2,BUN:2,CREATININE:2,CALCIUM:2 in the last 72 hours  Studies/Results: No results found.  Assessment/Plan: Postop day #2: We will continue to mobilize the patient with PT and OT. He will likely go home tomorrow.  Ulnar numbness: He sounds like he has had a pre-existing ulnar neuropathy which was exacerbated by positioning for surgery this likely will improve. I discussed with the patient his wife and I will start him on Neurontin.  LOS: 2 days     Erskin Zinda D 02/25/2012, 2:19 PM

## 2012-02-26 MED ORDER — OXYCODONE-ACETAMINOPHEN 10-325 MG PO TABS: 1.0000 | ORAL_TABLET | ORAL | Status: AC | PRN

## 2012-02-26 MED ORDER — DSS 100 MG PO CAPS: 100.0000 mg | ORAL_CAPSULE | Freq: Two times a day (BID) | ORAL | Status: AC

## 2012-02-26 MED ORDER — DIAZEPAM 5 MG PO TABS: 5.0000 mg | ORAL_TABLET | Freq: Four times a day (QID) | ORAL | Status: AC | PRN

## 2012-02-26 MED ORDER — GABAPENTIN 300 MG PO CAPS: 300.0000 mg | ORAL_CAPSULE | Freq: Three times a day (TID) | ORAL | Status: DC

## 2012-02-26 NOTE — Discharge Summary (Signed)
  Physician Discharge Summary  Patient ID: Rodney Wood MRN: [id number] DOB/AGE: [birth year] 51 y.o.  Admit date: 02/23/2012 Discharge date: 02/26/2012  Admission Diagnoses: L3-4, L4-5 and L5-S1 spinal stenosis, epidural lipomatosis, lumbar radiculopathy, lumbago  Discharge Diagnoses: The same Active Problems:  * No active hospital problems. *    Discharged Condition: good  Hospital Course: I admitted the patient to [facility] on 02/23/2012. On that day I performed at L4 and L5 laminectomy with bilateral L3 laminotomies. The surgery went well. Patient's postoperative course was remarkable only for exacerbation of his pre-existing right ulnar numbness. I attributed this to positioning during surgery. We treated him with Neurontin.  By 02/26/2012 the patient was doing well and requesting discharge to home. The patient was given oral and written discharge instructions. All his questions were answered.  Consults: None Significant Diagnostic Studies: None Treatments: L4-L5 laminectomy with bilateral L3 laminotomies to decompress the bilateral L3, L4, L5 and S1 nerve roots Discharge Exam: Blood pressure 130/84, pulse 80, temperature 97.9 F (36.6 C), temperature source Oral, resp. rate 18, height 5\' 9"  (1.753 m), weight 88.9 kg (195 lb 15.8 oz), SpO2 98.00%. The patient is alert and oriented. His strength is normal. His dressing is clean and dry.  Disposition: Home  Discharge Orders    Future Orders Please Complete By Expires   Diet - low sodium heart healthy      Increase activity slowly      Discharge instructions      Comments:   Call [phone] for followup appointment.   No dressing needed      Call MD for:  temperature >100.4      Call MD for:  persistant nausea and vomiting      Call MD for:  severe uncontrolled pain      Call MD for:  redness, tenderness, or signs of infection (pain, swelling, redness, odor or green/yellow discharge around incision site)      Call MD for:  difficulty breathing, headache or visual disturbances      Call MD for:  hives      Call MD for:  persistant dizziness or light-headedness      Call MD for:  extreme fatigue        Medication List  As of 02/26/2012  1:04 PM   STOP taking these medications         ibuprofen 600 MG tablet      traMADol 50 MG tablet         TAKE these medications         diazepam 5 MG tablet   Commonly known as: VALIUM   Take 1 tablet (5 mg total) by mouth every 6 (six) hours as needed.      DSS 100 MG Caps   Take 100 mg by mouth 2 (two) times daily.      etodolac 400 MG tablet   Commonly known as: LODINE   Take 400 mg by mouth 2 (two) times daily.      gabapentin 300 MG capsule   Commonly known as: NEURONTIN   Take 1 capsule (300 mg total) by mouth 3 (three) times daily.      oxyCODONE-acetaminophen 10-325 MG per tablet   Commonly known as: PERCOCET   Take 1 tablet by mouth every 4 (four) hours as needed for pain.             SignedTressie Stalker D 02/26/2012, 1:04 PM

## 2012-02-26 NOTE — Progress Notes (Signed)
Physical Therapy Treatment Patient Details Name: Rodney Wood MRN: [id number] DOB: [birth year] Today's Date: 02/26/2012 Time: 4098-1191 PT Time Calculation (min): 21 min  PT Assessment / Plan / Recommendation Comments on Treatment Session  Moving better today still a bit behind expected progress, encouraged pt to ambulate two more times today with nursing. Only able to recite 1/3 back precautions.     Follow Up Recommendations  Home health PT;Supervision for mobility/OOB    Barriers to Discharge        Equipment Recommendations  Rolling walker with 5" wheels;3 in 1 bedside comode    Recommendations for Other Services    Frequency     Plan Discharge plan remains appropriate    Precautions / Restrictions Precautions Precautions: Back Precaution Booklet Issued: Yes (comment) Restrictions Weight Bearing Restrictions: No       Mobility  Bed Mobility Rolling Left: 4: Min assist Left Sidelying to Sit: HOB flat;5: Supervision Details for Bed Mobility Assistance: min tactile and verbal cues for sequencing and follow through to roll, supervision sidelying->sit with verbal cues Transfers Transfers: Sit to Stand;Stand to Sit Sit to Stand: 4: Min guard;With upper extremity assist Stand to Sit: With upper extremity assist;6: Modified independent (Device/Increase time) Details for Transfer Assistance: cues for hand placement and follow through for upright standing Ambulation/Gait Ambulation/Gait Assistance: 5: Supervision;4: Min guard Ambulation Distance (Feet): 90 Feet Assistive device: Rolling walker Ambulation/Gait Assistance Details: increased distance today, only one bout of dizziness but it resolved with deep breathing and relaxation techniques, pt still needing cues for relaxed shoulder posture and to not hold breath duringtransfers; encouragement for increased distance      PT Goals Acute Rehab PT Goals PT Goal: Rolling Supine to Left Side - Progress: Progressing toward  goal PT Goal: Supine/Side to Sit - Progress: Progressing toward goal PT Goal: Sit to Stand - Progress: Progressing toward goal PT Goal: Stand to Sit - Progress: Met PT Goal: Ambulate - Progress: Progressing toward goal Additional Goals PT Goal: Additional Goal #1 - Progress: Progressing toward goal  Visit Information  Last PT Received On: 02/26/12 Assistance Needed: +1    Subjective Data  Subjective: Im just sore in my back.    Cognition  Overall Cognitive Status: Appears within functional limits for tasks assessed/performed Arousal/Alertness: Awake/alert Orientation Level: Appears intact for tasks assessed Behavior During Session: [facility] for tasks performed    Balance     End of Session PT - End of Session Equipment Utilized During Treatment: Gait belt Activity Tolerance: Patient tolerated treatment well Patient left: in chair;with call bell/phone within reach Nurse Communication: Mobility status   GP     [facility] HELEN 02/26/2012, 9:36 AM

## 2012-02-26 NOTE — Progress Notes (Signed)
Occupational Therapy Treatment Patient Details Name: Rodney Wood MRN: [id number] DOB: April 21, 1961 Today's Date: 02/26/2012 Time: 6295-2841 OT Time Calculation (min): 13 min  OT Assessment / Plan / Recommendation Comments on Treatment Session Educated pt on tub transfer technique, however pt unwillling to return demonstration today.  possible d/c this PM.    Follow Up Recommendations  No OT follow up    Barriers to Discharge       Equipment Recommendations  Rolling walker with 5" wheels;3 in 1 bedside comode    Recommendations for Other Services    Frequency Min 2X/week   Plan Discharge plan remains appropriate    Precautions / Restrictions Precautions Precautions: Back   Pertinent Vitals/Pain See vitals    ADL  Tub/Shower Transfer:  (educated and demonstrated tub transfer to pt) Transfers/Ambulation Related to ADLs: pt declining OOB activity ADL Comments: Session limited today by pt refusal to perform OOB activity. Educated pt and wife on tub transfer technique using simulation in room. Pt and wife verbalized understanding.  Pt's tub is on second floor, and he reports he may wait several days before attempting shower in tub.  Wife reports that 3n1 will fit in their tub, therefore encouraged pt to use 3n1 as shower seat.    OT Diagnosis:    OT Problem List:   OT Treatment Interventions:     OT Goals ADL Goals Pt Will Perform Tub/Shower Transfer: Tub transfer;with supervision ADL Goal: Tub/Shower Transfer - Progress: Progressing toward goals  Visit Information  Last OT Received On: 02/26/12 Assistance Needed: +1    Subjective Data      Prior Functioning       Cognition  Overall Cognitive Status: Appears within functional limits for tasks assessed/performed Arousal/Alertness: Awake/alert Orientation Level: Appears intact for tasks assessed Behavior During Session: [facility] for tasks performed    Mobility  Shoulder Instructions Bed Mobility Bed Mobility: Not  assessed Transfers Transfers: Not assessed       Exercises      Balance     End of Session OT - End of Session Activity Tolerance: Patient limited by fatigue;Patient limited by pain Patient left: in bed;with call bell/phone within reach;with family/visitor present  GO    02/26/2012 Cipriano Mile OTR/L Pager [phone] Office [phone]  Cipriano Mile 02/26/2012, 3:11 PM

## 2013-05-11 ENCOUNTER — Other Ambulatory Visit: Payer: Self-pay | Admitting: Neurosurgery

## 2013-05-16 ENCOUNTER — Encounter (HOSPITAL_COMMUNITY): Payer: Self-pay | Admitting: Pharmacy Technician

## 2013-05-19 ENCOUNTER — Encounter (HOSPITAL_COMMUNITY): Payer: Self-pay

## 2013-05-19 ENCOUNTER — Encounter (HOSPITAL_COMMUNITY)
Admission: RE | Admit: 2013-05-19 | Discharge: 2013-05-19 | Disposition: A | Discharge status: Home / Self Care | Payer: Federal, State, Local not specified - PPO | Source: Ambulatory Visit | Attending: Neurosurgery | Admitting: Neurosurgery

## 2013-05-19 DIAGNOSIS — Z01812 Encounter for preprocedural laboratory examination: Secondary | ICD-10-CM | POA: Insufficient documentation

## 2013-05-19 DIAGNOSIS — Z01818 Encounter for other preprocedural examination: Secondary | ICD-10-CM | POA: Insufficient documentation

## 2013-05-19 HISTORY — DX: Headache: R51

## 2013-05-19 HISTORY — DX: Unspecified osteoarthritis, unspecified site: M19.90

## 2013-05-19 LAB — BASIC METABOLIC PANEL
BUN: 14 mg/dL (ref 6–23)
Calcium: 9.7 mg/dL (ref 8.4–10.5)
GFR calc non Af Amer: 76 mL/min — ABNORMAL LOW (ref >90)
Glucose, Bld: 115 mg/dL — ABNORMAL HIGH (ref 70–99)

## 2013-05-19 LAB — CBC
Hemoglobin: 15 g/dL (ref 13.0–17.0)
MCH: 31.3 pg (ref 26.0–34.0)
MCHC: 34.2 g/dL (ref 30.0–36.0)
MCV: 91.4 fL (ref 78.0–100.0)
Platelets: 238 10*3/uL (ref 150–400)

## 2013-05-19 LAB — TYPE AND SCREEN: ABO/RH(D): O POS

## 2013-05-19 NOTE — Pre-Procedure Instructions (Signed)
Rodney Wood  05/19/2013   Your procedure is scheduled on:  05/30/13  Report to [facility]  2 * 3 at 9 AM.  Call this number if you have problems the morning of surgery: [phone]   Remember:   Do not eat food or drink liquids after midnight.   Take these medicines the morning of surgery with A SIP OF WATER: pain medication   Do not wear jewelry, make-up or nail polish.  Do not wear lotions, powders, or perfumes. You may wear deodorant.  Do not shave 48 hours prior to surgery. Men may shave face and neck.  Do not bring valuables to the hospital.  [facility] is not responsible                  for any belongings or valuables.               Contacts, dentures or bridgework may not be worn into surgery.  Leave suitcase in the car. After surgery it may be brought to your room.  For patients admitted to the hospital, discharge time is determined by your                treatment team.               Patients discharged the day of surgery will not be allowed to drive  home.  Name and phone number of your driver: family  Special Instructions: Shower using CHG 2 nights before surgery and the night before surgery.  If you shower the day of surgery use CHG.  Use special wash - you have one bottle of CHG for all showers.  You should use approximately 1/3 of the bottle for each shower.   Please read over the following fact sheets that you were given: Pain Booklet, Coughing and Deep Breathing, Blood Transfusion Information, MRSA Information and Surgical Site Infection Prevention

## 2013-05-29 MED ORDER — CEFAZOLIN SODIUM-DEXTROSE 2-3 GM-% IV SOLR
2.0000 g | INTRAVENOUS | Status: AC
  Administered 2013-05-30: 2 g via INTRAVENOUS
  Filled 2013-05-29 (×2): qty 50

## 2013-05-30 ENCOUNTER — Inpatient Hospital Stay (HOSPITAL_COMMUNITY): Payer: Federal, State, Local not specified - PPO | Admitting: Anesthesiology

## 2013-05-30 ENCOUNTER — Inpatient Hospital Stay (HOSPITAL_COMMUNITY): Payer: Federal, State, Local not specified - PPO

## 2013-05-30 ENCOUNTER — Encounter (HOSPITAL_COMMUNITY): Payer: Self-pay | Admitting: Anesthesiology

## 2013-05-30 ENCOUNTER — Inpatient Hospital Stay (HOSPITAL_COMMUNITY)
Admission: RE | Admit: 2013-05-30 | Discharge: 2013-06-04 | DRG: 460 | Disposition: A | Discharge status: Home / Self Care | Payer: Federal, State, Local not specified - PPO | Source: Ambulatory Visit | Attending: Neurosurgery | Admitting: Neurosurgery

## 2013-05-30 ENCOUNTER — Encounter (HOSPITAL_COMMUNITY)
Admission: RE | Disposition: A | Discharge status: Home / Self Care | Payer: Self-pay | Source: Ambulatory Visit | Attending: Neurosurgery

## 2013-05-30 ENCOUNTER — Encounter (HOSPITAL_COMMUNITY): Payer: Federal, State, Local not specified - PPO | Admitting: Anesthesiology

## 2013-05-30 DIAGNOSIS — M4316 Spondylolisthesis, lumbar region: Secondary | ICD-10-CM | POA: Diagnosis present

## 2013-05-30 DIAGNOSIS — Q762 Congenital spondylolisthesis: Secondary | ICD-10-CM

## 2013-05-30 DIAGNOSIS — M51379 Other intervertebral disc degeneration, lumbosacral region without mention of lumbar back pain or lower extremity pain: Principal | ICD-10-CM | POA: Diagnosis present

## 2013-05-30 DIAGNOSIS — M5137 Other intervertebral disc degeneration, lumbosacral region: Principal | ICD-10-CM | POA: Diagnosis present

## 2013-05-30 SURGERY — POSTERIOR LUMBAR FUSION 1 LEVEL
Anesthesia: General | Site: Spine Lumbar | Wound class: Clean

## 2013-05-30 MED ORDER — HYDROCODONE-ACETAMINOPHEN 5-325 MG PO TABS
1.0000 | ORAL_TABLET | ORAL | Status: DC | PRN
  Administered 2013-06-01 – 2013-06-04 (×5): 2 via ORAL
  Filled 2013-05-30 (×5): qty 2

## 2013-05-30 MED ORDER — LACTATED RINGERS IV SOLN
INTRAVENOUS | Status: DC
  Administered 2013-05-30: 10:00:00 via INTRAVENOUS

## 2013-05-30 MED ORDER — ONDANSETRON HCL 4 MG/2ML IJ SOLN: 4.0000 mg | INTRAMUSCULAR | Status: DC | PRN

## 2013-05-30 MED ORDER — 0.9 % SODIUM CHLORIDE (POUR BTL) OPTIME
TOPICAL | Status: DC | PRN
  Administered 2013-05-30: 1000 mL

## 2013-05-30 MED ORDER — BACITRACIN ZINC 500 UNIT/GM EX OINT
TOPICAL_OINTMENT | CUTANEOUS | Status: DC | PRN
  Administered 2013-05-30: 1 via TOPICAL

## 2013-05-30 MED ORDER — ONDANSETRON HCL 4 MG/2ML IJ SOLN: 4.0000 mg | Freq: Four times a day (QID) | INTRAMUSCULAR | Status: DC | PRN

## 2013-05-30 MED ORDER — DIPHENHYDRAMINE HCL 50 MG/ML IJ SOLN: 12.5000 mg | Freq: Four times a day (QID) | INTRAMUSCULAR | Status: DC | PRN

## 2013-05-30 MED ORDER — ZOLPIDEM TARTRATE 5 MG PO TABS: 5.0000 mg | ORAL_TABLET | Freq: Every evening | ORAL | Status: DC | PRN

## 2013-05-30 MED ORDER — NEOSTIGMINE METHYLSULFATE 1 MG/ML IJ SOLN
INTRAMUSCULAR | Status: DC | PRN
  Administered 2013-05-30: 3 mg via INTRAVENOUS

## 2013-05-30 MED ORDER — ACETAMINOPHEN 650 MG RE SUPP: 650.0000 mg | RECTAL | Status: DC | PRN

## 2013-05-30 MED ORDER — ACETAMINOPHEN 325 MG PO TABS: 650.0000 mg | ORAL_TABLET | ORAL | Status: DC | PRN

## 2013-05-30 MED ORDER — FENTANYL CITRATE 0.05 MG/ML IJ SOLN
INTRAMUSCULAR | Status: DC | PRN
  Administered 2013-05-30: 50 ug via INTRAVENOUS
  Administered 2013-05-30 (×2): 25 ug via INTRAVENOUS
  Administered 2013-05-30: 50 ug via INTRAVENOUS
  Administered 2013-05-30: 100 ug via INTRAVENOUS
  Administered 2013-05-30 (×4): 50 ug via INTRAVENOUS

## 2013-05-30 MED ORDER — DIAZEPAM 5 MG PO TABS
5.0000 mg | ORAL_TABLET | Freq: Four times a day (QID) | ORAL | Status: DC | PRN
  Administered 2013-05-30 – 2013-06-04 (×9): 5 mg via ORAL
  Filled 2013-05-30 (×9): qty 1

## 2013-05-30 MED ORDER — ALUM & MAG HYDROXIDE-SIMETH 200-200-20 MG/5ML PO SUSP: 30.0000 mL | Freq: Four times a day (QID) | ORAL | Status: DC | PRN

## 2013-05-30 MED ORDER — ONDANSETRON HCL 4 MG/2ML IJ SOLN
INTRAMUSCULAR | Status: DC | PRN
  Administered 2013-05-30: 4 mg via INTRAVENOUS

## 2013-05-30 MED ORDER — MIDAZOLAM HCL 5 MG/5ML IJ SOLN
INTRAMUSCULAR | Status: DC | PRN
  Administered 2013-05-30 (×2): 1 mg via INTRAVENOUS

## 2013-05-30 MED ORDER — OXYCODONE HCL 5 MG PO TABS
ORAL_TABLET | ORAL | Status: AC
  Administered 2013-05-30: 5 mg
  Filled 2013-05-30: qty 1

## 2013-05-30 MED ORDER — BUPIVACAINE LIPOSOME 1.3 % IJ SUSP
20.0000 mL | INTRAMUSCULAR | Status: AC
  Filled 2013-05-30: qty 20

## 2013-05-30 MED ORDER — MORPHINE SULFATE 2 MG/ML IJ SOLN
1.0000 mg | INTRAMUSCULAR | Status: DC | PRN
  Administered 2013-05-30: 2 mg via INTRAVENOUS
  Filled 2013-05-30: qty 1

## 2013-05-30 MED ORDER — ROCURONIUM BROMIDE 100 MG/10ML IV SOLN
INTRAVENOUS | Status: DC | PRN
  Administered 2013-05-30: 20 mg via INTRAVENOUS
  Administered 2013-05-30: 50 mg via INTRAVENOUS
  Administered 2013-05-30: 10 mg via INTRAVENOUS
  Administered 2013-05-30: 20 mg via INTRAVENOUS

## 2013-05-30 MED ORDER — SODIUM CHLORIDE 0.9 % IJ SOLN: 9.0000 mL | INTRAMUSCULAR | Status: DC | PRN

## 2013-05-30 MED ORDER — PROPOFOL 10 MG/ML IV BOLUS
INTRAVENOUS | Status: DC | PRN
  Administered 2013-05-30: 180 mg via INTRAVENOUS

## 2013-05-30 MED ORDER — SODIUM CHLORIDE 0.9 % IR SOLN
Status: DC | PRN
  Administered 2013-05-30: 13:00:00

## 2013-05-30 MED ORDER — OXYCODONE HCL 5 MG PO TABS: 5.0000 mg | ORAL_TABLET | Freq: Once | ORAL | Status: DC | PRN

## 2013-05-30 MED ORDER — LIDOCAINE HCL (CARDIAC) 20 MG/ML IV SOLN
INTRAVENOUS | Status: DC | PRN
  Administered 2013-05-30: 100 mg via INTRAVENOUS

## 2013-05-30 MED ORDER — BUPIVACAINE-EPINEPHRINE PF 0.5-1:200000 % IJ SOLN
INTRAMUSCULAR | Status: DC | PRN
  Administered 2013-05-30: 10 mL via PERINEURAL

## 2013-05-30 MED ORDER — PHENOL 1.4 % MT LIQD: 1.0000 | OROMUCOSAL | Status: DC | PRN

## 2013-05-30 MED ORDER — LACTATED RINGERS IV SOLN
INTRAVENOUS | Status: DC
  Administered 2013-05-30: 1000 mL via INTRAVENOUS

## 2013-05-30 MED ORDER — BUPIVACAINE LIPOSOME 1.3 % IJ SUSP
INTRAMUSCULAR | Status: DC | PRN
  Administered 2013-05-30: 20 mL

## 2013-05-30 MED ORDER — MORPHINE SULFATE (PF) 1 MG/ML IV SOLN
INTRAVENOUS | Status: DC
  Administered 2013-05-30: 20:00:00 via INTRAVENOUS
  Administered 2013-05-31: 3 mg via INTRAVENOUS
  Administered 2013-05-31: 1.5 mg via INTRAVENOUS
  Filled 2013-05-30: qty 25

## 2013-05-30 MED ORDER — LACTATED RINGERS IV SOLN
INTRAVENOUS | Status: DC | PRN
  Administered 2013-05-30 (×2): via INTRAVENOUS

## 2013-05-30 MED ORDER — MENTHOL 3 MG MT LOZG: 1.0000 | LOZENGE | OROMUCOSAL | Status: DC | PRN

## 2013-05-30 MED ORDER — DIPHENHYDRAMINE HCL 12.5 MG/5ML PO ELIX: 12.5000 mg | ORAL_SOLUTION | Freq: Four times a day (QID) | ORAL | Status: DC | PRN

## 2013-05-30 MED ORDER — HYDROMORPHONE HCL PF 1 MG/ML IJ SOLN
INTRAMUSCULAR | Status: AC
  Administered 2013-05-30: 0.5 mg
  Filled 2013-05-30: qty 1

## 2013-05-30 MED ORDER — NALOXONE HCL 0.4 MG/ML IJ SOLN: 0.4000 mg | INTRAMUSCULAR | Status: DC | PRN

## 2013-05-30 MED ORDER — MIDAZOLAM HCL 2 MG/2ML IJ SOLN: 1.0000 mg | INTRAMUSCULAR | Status: DC | PRN

## 2013-05-30 MED ORDER — CEFAZOLIN SODIUM-DEXTROSE 2-3 GM-% IV SOLR
2.0000 g | Freq: Three times a day (TID) | INTRAVENOUS | Status: AC
  Administered 2013-05-30 – 2013-05-31 (×2): 2 g via INTRAVENOUS
  Filled 2013-05-30 (×2): qty 50

## 2013-05-30 MED ORDER — PROMETHAZINE HCL 25 MG/ML IJ SOLN: 6.2500 mg | INTRAMUSCULAR | Status: DC | PRN

## 2013-05-30 MED ORDER — OXYCODONE-ACETAMINOPHEN 5-325 MG PO TABS
1.0000 | ORAL_TABLET | ORAL | Status: DC | PRN
  Administered 2013-05-31 – 2013-06-04 (×15): 2 via ORAL
  Filled 2013-05-30 (×15): qty 2

## 2013-05-30 MED ORDER — INFLUENZA VAC SPLIT QUAD 0.5 ML IM SUSP
0.5000 mL | INTRAMUSCULAR | Status: AC
  Administered 2013-05-31: 0.5 mL via INTRAMUSCULAR
  Filled 2013-05-30 (×2): qty 0.5

## 2013-05-30 MED ORDER — HYDROMORPHONE HCL PF 1 MG/ML IJ SOLN
0.2500 mg | INTRAMUSCULAR | Status: DC | PRN
  Administered 2013-05-30: 0.5 mg via INTRAVENOUS

## 2013-05-30 MED ORDER — GLYCOPYRROLATE 0.2 MG/ML IJ SOLN
INTRAMUSCULAR | Status: DC | PRN
  Administered 2013-05-30: 0.6 mg via INTRAVENOUS

## 2013-05-30 MED ORDER — FENTANYL CITRATE 0.05 MG/ML IJ SOLN: 50.0000 ug | Freq: Once | INTRAMUSCULAR | Status: DC

## 2013-05-30 MED ORDER — OXYCODONE HCL 5 MG/5ML PO SOLN: 5.0000 mg | Freq: Once | ORAL | Status: DC | PRN

## 2013-05-30 MED ORDER — THROMBIN 20000 UNITS EX SOLR
CUTANEOUS | Status: DC | PRN
  Administered 2013-05-30: 13:00:00 via TOPICAL

## 2013-05-30 MED ORDER — DOCUSATE SODIUM 100 MG PO CAPS
100.0000 mg | ORAL_CAPSULE | Freq: Two times a day (BID) | ORAL | Status: DC
  Administered 2013-05-30 – 2013-06-04 (×10): 100 mg via ORAL
  Filled 2013-05-30 (×10): qty 1

## 2013-05-30 SURGICAL SUPPLY — 66 items
APL SKNCLS STERI-STRIP NONHPOA (GAUZE/BANDAGES/DRESSINGS) ×1
BAG DECANTER FOR FLEXI CONT (MISCELLANEOUS) ×2 IMPLANT
BENZOIN TINCTURE PRP APPL 2/3 (GAUZE/BANDAGES/DRESSINGS) ×2 IMPLANT
BLADE SURG ROTATE 9660 (MISCELLANEOUS) IMPLANT
BRUSH SCRUB EZ PLAIN DRY (MISCELLANEOUS) ×2 IMPLANT
BUR ACORN 6.0 (BURR) ×2 IMPLANT
BUR MATCHSTICK NEURO 3.0 LAGG (BURR) ×2 IMPLANT
CANISTER SUCT 3000ML (MISCELLANEOUS) ×2 IMPLANT
CAP REVERE LOCKING (Cap) ×8 IMPLANT
CONT SPEC 4OZ CLIKSEAL STRL BL (MISCELLANEOUS) ×2 IMPLANT
COVER BACK TABLE 24X17X13 BIG (DRAPES) IMPLANT
COVER TABLE BACK 60X90 (DRAPES) ×2 IMPLANT
DRAPE C-ARM 42X72 X-RAY (DRAPES) ×4 IMPLANT
DRAPE LAPAROTOMY 100X72X124 (DRAPES) ×2 IMPLANT
DRAPE POUCH INSTRU U-SHP 10X18 (DRAPES) ×2 IMPLANT
DRAPE PROXIMA HALF (DRAPES) ×4 IMPLANT
DRAPE SURG 17X23 STRL (DRAPES) ×8 IMPLANT
ELECT BLADE 4.0 EZ CLEAN MEGAD (MISCELLANEOUS) ×2
ELECT REM PT RETURN 9FT ADLT (ELECTROSURGICAL) ×2
ELECTRODE BLDE 4.0 EZ CLN MEGD (MISCELLANEOUS) ×1 IMPLANT
ELECTRODE REM PT RTRN 9FT ADLT (ELECTROSURGICAL) ×1 IMPLANT
EVACUATOR 1/8 PVC DRAIN (DRAIN) ×2 IMPLANT
GAUZE SPONGE 4X4 16PLY XRAY LF (GAUZE/BANDAGES/DRESSINGS) IMPLANT
GLOVE BIO SURGEON STRL SZ8.5 (GLOVE) ×4 IMPLANT
GLOVE BIOGEL PI IND STRL 7.0 (GLOVE) ×1 IMPLANT
GLOVE BIOGEL PI INDICATOR 7.0 (GLOVE) ×1
GLOVE ECLIPSE 8.5 STRL (GLOVE) ×2 IMPLANT
GLOVE EXAM NITRILE LRG STRL (GLOVE) IMPLANT
GLOVE EXAM NITRILE MD LF STRL (GLOVE) ×4 IMPLANT
GLOVE EXAM NITRILE XL STR (GLOVE) IMPLANT
GLOVE EXAM NITRILE XS STR PU (GLOVE) IMPLANT
GLOVE SS BIOGEL STRL SZ 8 (GLOVE) ×2 IMPLANT
GLOVE SUPERSENSE BIOGEL SZ 8 (GLOVE) ×2
GLOVE SURG SS PI 7.0 STRL IVOR (GLOVE) ×4 IMPLANT
GOWN BRE IMP SLV AUR LG STRL (GOWN DISPOSABLE) IMPLANT
GOWN BRE IMP SLV AUR XL STRL (GOWN DISPOSABLE) ×8 IMPLANT
GOWN STRL REIN 2XL LVL4 (GOWN DISPOSABLE) IMPLANT
KIT BASIN OR (CUSTOM PROCEDURE TRAY) ×2 IMPLANT
KIT ROOM TURNOVER OR (KITS) ×2 IMPLANT
NEEDLE HYPO 21X1.5 SAFETY (NEEDLE) ×2 IMPLANT
NEEDLE HYPO 22GX1.5 SAFETY (NEEDLE) ×2 IMPLANT
NS IRRIG 1000ML POUR BTL (IV SOLUTION) ×2 IMPLANT
PACK FOAM VITOSS 10CC (Orthopedic Implant) ×2 IMPLANT
PACK LAMINECTOMY NEURO (CUSTOM PROCEDURE TRAY) ×2 IMPLANT
PAD ARMBOARD 7.5X6 YLW CONV (MISCELLANEOUS) ×10 IMPLANT
PATTIES SURGICAL .5 X1 (DISPOSABLE) IMPLANT
PUTTY 10ML ACTIFUSE ABX (Putty) ×2 IMPLANT
ROD CURVED REVERE 6.35X50MM (Rod) ×4 IMPLANT
SCREW REVERE 6.35 6.5X55MM (Screw) ×4 IMPLANT
SCREW REVERE 6.5X50MM (Screw) ×4 IMPLANT
SPACER SUSTAIN O SM1 10X22 10M (Spacer) ×4 IMPLANT
SPONGE GAUZE 4X4 12PLY (GAUZE/BANDAGES/DRESSINGS) ×2 IMPLANT
SPONGE LAP 4X18 X RAY DECT (DISPOSABLE) IMPLANT
SPONGE NEURO XRAY DETECT 1X3 (DISPOSABLE) IMPLANT
SPONGE SURGIFOAM ABS GEL 100 (HEMOSTASIS) ×2 IMPLANT
STRIP CLOSURE SKIN 1/2X4 (GAUZE/BANDAGES/DRESSINGS) ×2 IMPLANT
SUT VIC AB 1 CT1 18XBRD ANBCTR (SUTURE) ×2 IMPLANT
SUT VIC AB 1 CT1 8-18 (SUTURE) ×4
SUT VIC AB 2-0 CP2 18 (SUTURE) ×4 IMPLANT
SYR 20CC LL (SYRINGE) ×2 IMPLANT
SYR 20ML ECCENTRIC (SYRINGE) ×2 IMPLANT
TAPE CLOTH SURG 4X10 WHT LF (GAUZE/BANDAGES/DRESSINGS) ×2 IMPLANT
TOWEL OR 17X24 6PK STRL BLUE (TOWEL DISPOSABLE) ×2 IMPLANT
TOWEL OR 17X26 10 PK STRL BLUE (TOWEL DISPOSABLE) ×2 IMPLANT
TRAY FOLEY CATH 14FRSI W/METER (CATHETERS) IMPLANT
WATER STERILE IRR 1000ML POUR (IV SOLUTION) ×2 IMPLANT

## 2013-05-30 NOTE — Progress Notes (Signed)
Patient ID: Rodney Wood, male   DOB: October 25, 1960, 52 y.o.   MRN: [id number] Subjective:  The patient is somnolent but easily arousable. He is in no apparent distress. He looks well.  Objective: Vital signs in last 24 hours: Temp:  [97.1 F (36.2 C)] 97.1 F (36.2 C) (12/01 0914) Pulse Rate:  [77] 77 (12/01 0914) Resp:  [20] 20 (12/01 0914) BP: (150)/(101) 150/101 mmHg (12/01 0914) SpO2:  [98 %] 98 % (12/01 0914)  Intake/Output from previous day:   Intake/Output this shift: Total I/O In: 1000 [I.V.:1000] Out: 650 [Urine:450; Blood:200]  Physical exam the patient is somnolent but easily arousable. He is moving his lower extremities well.  Lab Results: No results found for this basename: WBC, HGB, HCT, PLT,  in the last 72 hours BMET No results found for this basename: NA, K, CL, CO2, GLUCOSE, BUN, CREATININE, CALCIUM,  in the last 72 hours  Studies/Results: Dg Lumbar Spine 1 View  05/30/2013   CLINICAL DATA:  Lumbar disc disease.  EXAM: LUMBAR SPINE - 1 VIEW  COMPARISON:  MRI dated 08/09/2012  FINDINGS: Single lateral view of the lumbar spine demonstrates an instrument at the level of the pedicles of L4.  IMPRESSION: Instrument at the level of the pedicles of L4.   Electronically Signed   By: Geanie Cooley M.D.   On: 05/30/2013 15:28    Assessment/Plan: The patient is doing well.  LOS: 0 days     Rodney Wood D 05/30/2013, 4:06 PM

## 2013-05-30 NOTE — Anesthesia Postprocedure Evaluation (Signed)
  Anesthesia Post-op Note  Patient: Rodney Wood  Procedure(s) Performed: Procedure(s): LUMBAR FOUR-FIVE LAMINECTOMY  WITH POSTERIOR LUMBAR INTERBODY FUSION WITH INTERBODY PROSTHESIS POSTERIOR LATERAL ARTHRODESIS AND POSTERIOR NONSEGMENTAL INSTRUMENTATION (N/A)  Patient Location: PACU  Anesthesia Type:General  Level of Consciousness: awake  Airway and Oxygen Therapy: Patient Spontanous Breathing  Post-op Pain: mild  Post-op Assessment: Post-op Vital signs reviewed, Patient's Cardiovascular Status Stable, Respiratory Function Stable, Patent Airway, No signs of Nausea or vomiting and Pain level controlled  Post-op Vital Signs: Reviewed and stable  Complications: No apparent anesthesia complications

## 2013-05-30 NOTE — H&P (Signed)
Subjective: Patient is a 52 year old male who has complained of back and leg pain consistent with neurogenic claudication. He has failed medical management worked up with a lumbar MRI and lumbar x-rays. This demonstrated an L4-5 spondylolisthesis with spinal stenosis. I discussed the various treatment options with the patient including surgery. He has weighed the risks, benefits, and alternatives surgery and decided proceed with an L4-5 decompression, instrumentation, and fusion.   Past Medical History  Diagnosis Date  . Back pain     scheduled for surgery  01/2012  . Broken toe     broke last week  . Headache(784.0)   . Arthritis     Past Surgical History  Procedure Laterality Date  . Back surgery  99  . Lumbar laminectomy/decompression microdiscectomy  02/23/2012    Procedure: LUMBAR LAMINECTOMY/DECOMPRESSION MICRODISCECTOMY 2 LEVELS;  Surgeon: Cristi Loron, MD;  Location: [facility];  Service: Neurosurgery;  Laterality: Bilateral;  Lumbar three-four, four-five Laminectomy with evacuation of epidural lipomatosis    No Known Allergies  History  Substance Use Topics  . Smoking status: Never Smoker   . Smokeless tobacco: Not on file  . Alcohol Use: Yes     Comment: occ    History reviewed. No pertinent family history. Prior to Admission medications   Medication Sig Start Date End Date Taking? Authorizing Provider  oxyCODONE-acetaminophen (PERCOCET) 7.5-325 MG per tablet Take 1 tablet by mouth 2 (two) times daily as needed for pain.   Yes Historical Provider, MD     Review of Systems  Positive ROS: As above  All other systems have been reviewed and were otherwise negative with the exception of those mentioned in the HPI and as above.  Objective: Vital signs in last 24 hours: Temp:  [97.1 F (36.2 C)] 97.1 F (36.2 C) (12/01 0914) Pulse Rate:  [77] 77 (12/01 0914) Resp:  [20] 20 (12/01 0914) BP: (150)/(101) 150/101 mmHg (12/01 0914) SpO2:  [98 %] 98 % (12/01  0914)  General Appearance: Alert, cooperative, no distress, appears stated age Head: Normocephalic, without obvious abnormality, atraumatic Eyes: PERRL, conjunctiva/corneas clear, EOM's intact, fundi benign, both eyes      Ears: Normal TM's and external ear canals, both ears Throat: Lips, mucosa, and tongue normal; teeth and gums normal Neck: Supple, symmetrical, trachea midline, no adenopathy; thyroid: No enlargement/tenderness/nodules; no carotid bruit or JVD Back: Symmetric, no curvature, ROM normal, no CVA tenderness Lungs: Clear to auscultation bilaterally, respirations unlabored Heart: Regular rate and rhythm, S1 and S2 normal, no murmur, rub or gallop Abdomen: Soft, non-tender, bowel sounds active all four quadrants, no masses, no organomegaly Extremities: Extremities normal, atraumatic, no cyanosis or edema Pulses: 2+ and symmetric all extremities Skin: Skin color, texture, turgor normal, no rashes or lesions  NEUROLOGIC:   Mental status: alert and oriented, no aphasia, good attention span, Fund of knowledge/ memory ok Motor Exam - grossly normal Sensory Exam - grossly normal Reflexes:  Coordination - grossly normal Gait - grossly normal Balance - grossly normal Cranial Nerves: I: smell Not tested  II: visual acuity  OS: Normal    OD: Normal   II: visual fields Full to confrontation  II: pupils Equal, round, reactive to light  III,VII: ptosis None  III,IV,VI: extraocular muscles  Full ROM  V: mastication Normal  V: facial light touch sensation  Normal  V,VII: corneal reflex  Present  VII: facial muscle function - upper  Normal  VII: facial muscle function - lower Normal  VIII: hearing Not tested  IX: soft palate elevation  Normal  IX,X: gag reflex Present  XI: trapezius strength  5/5  XI: sternocleidomastoid strength 5/5  XI: neck flexion strength  5/5  XII: tongue strength  Normal    Data Review Lab Results  Component Value Date   WBC 6.0 05/19/2013   HGB  15.0 05/19/2013   HCT 43.8 05/19/2013   MCV 91.4 05/19/2013   PLT 238 05/19/2013   Lab Results  Component Value Date   NA 138 05/19/2013   K 3.8 05/19/2013   CL 103 05/19/2013   CO2 23 05/19/2013   BUN 14 05/19/2013   CREATININE 1.08 05/19/2013   GLUCOSE 115* 05/19/2013   No results found for this basename: INR, PROTIME    Assessment/Plan: L4-5 spondylolisthesis, spinal stenosis, lumbago, lumbar radiculopathy, neurogenic claudication: I discussed the situation with the patient. I reviewed his imaging studies with them and pointed out the abnormalities. We have discussed the various treatment options including surgery. I described the surgical treatment option of an L4-5 decompression, instrumentation, and fusion. I have shown him surgical models. We have discussed the risks, benefits, alternatives, and likelihood of achieving our goals with surgery. I have answered all the patient's questions. He has decided to proceed with surgery.   Latarshia Jersey D 05/30/2013 11:19 AM

## 2013-05-30 NOTE — Anesthesia Procedure Notes (Signed)
Procedure Name: Intubation Date/Time: 05/30/2013 12:01 PM Performed by: Whitman Hero Pre-anesthesia Checklist: Patient identified, Emergency Drugs available, Suction available and Patient being monitored Patient Re-evaluated:Patient Re-evaluated prior to inductionOxygen Delivery Method: Circle system utilized Preoxygenation: Pre-oxygenation with 100% oxygen Intubation Type: IV induction Ventilation: Mask ventilation without difficulty Laryngoscope Size: Mac and 3 Grade View: Grade I Tube type: Oral Tube size: 8.0 mm Number of attempts: 1 Airway Equipment and Method: Stylet Placement Confirmation: ETT inserted through vocal cords under direct vision,  positive ETCO2 and breath sounds checked- equal and bilateral Secured at: 23 cm Tube secured with: Tape Dental Injury: Teeth and Oropharynx as per pre-operative assessment

## 2013-05-30 NOTE — Op Note (Signed)
Brief history: The patient is a 52 year old black male who was previously undergone a L. re\re for an L4-5 laminectomy. He has had chronic back and leg pain consistent with lumbago, lumbar radiculopathy. He has failed medical management and was worked up with a lumbar MRI lumbar x-rays. This demonstrated he has a grade 1 spondylolisthesis at L4-5 with severe facet arthropathy. I discussed the various treatment options with the patient including surgery. He has weighed the risks, benefits, and alternatives surgery and decided proceed with a redo L4 laminectomy with L4-5 decompression instrumentation and fusion.  Preoperative diagnosis: L4-5 spondylolisthesis, Degenerative disc disease, spinal stenosis compressing both the L4 and the L5 nerve roots; lumbago; lumbar radiculopathy  Postoperative diagnosis: The same  Procedure: Redo L4 laminectomy and foraminotomies to decompress the bilateral L4 and L5 nerve roots(the work required to do this was in addition to the work required to do the posterior lumbar interbody fusion because of the patient's spinal stenosis, facet arthropathy. Etc. requiring a wide decompression of the nerve roots.); L4-5 posterior lumbar interbody fusion with local morselized autograft bone and Actifusebone graft extender; insertion of interbody prosthesis at L4-5 (globus peek interbody prosthesis); posterior nonsegmental instrumentation from L4 to L5 with globus titanium pedicle screws and rods; posterior lateral arthrodesis at L4-5 with local morselized autograft bone and Vitoss bone graft extender.  Surgeon: Dr. Delma Officer  Asst.: Dr. Mardelle Matte pool  Anesthesia: Gen. endotracheal  Estimated blood loss: 200 cc  Drains: One medium Hemovac  Complications: None  Description of procedure: The patient was brought to the operating room by the anesthesia team. General endotracheal anesthesia was induced. The patient was turned to the prone position on the Wilson frame. The patient's  lumbosacral region was then prepared with Betadine scrub and Betadine solution. Sterile drapes were applied.  I then injected the area to be incised with Marcaine with epinephrine solution. I then used the scalpel to make a linear midline incision over the L4-5 interspace, incising through the patient's old surgical scar. I then used electrocautery to perform a bilateral subperiosteal dissection exposing the spinous process and lamina of L3 and L5 as well as the facets at L4-5. We then obtained intraoperative radiograph to confirm our location. We then inserted the Verstrac retractor to provide exposure.  I began the decompression by using the high speed drill to widen the prior laminectomy at L4. We carefully dissected through the epidural scar tissue and expose the dura and the bilateral L4 and L5 nerve roots We then used the Kerrison punches to widen the laminectomy and removed the remaining ligamentum flavum at L4-5. We used the Kerrison punches to remove the medial facets at L4-5. We performed wide foraminotomies about the bilateral L4 and L5 nerve roots completing the decompression.  We now turned our attention to the posterior lumbar interbody fusion. I used a scalpel to incise the intervertebral disc at L4-5 bilaterally. I then performed a partial intervertebral discectomy at L4-5 bilaterally using the pituitary forceps. We prepared the vertebral endplates at L4-5 for the fusion by removing the soft tissues with the curettes. We then used the trial spacers to pick the appropriate sized interbody prosthesis. We prefilled his prosthesis with a combination of local morselized autograft bone that we obtained during the decompression as well as Actifuse bone graft extender. We inserted the prefilled prosthesis into the interspace at L4-5. There was a good snug fit of the prosthesis in the interspace. We then filled and the remainder of the intervertebral disc space with local  morselized autograft bone and  Actifuse. This completed the posterior lumbar interbody arthrodesis.  We now turned attention to the instrumentation. Under fluoroscopic guidance we cannulated the bilateral L4 and L5 pedicles with the bone probe. We then removed the bone probe. We then tapped the pedicle with a 5.5 millimeter tap. We then removed the tap. We probed inside the tapped pedicle with a ball probe to rule out cortical breaches. We then inserted a 6.5 x 50 and 55 millimeter pedicle screw into the L4 and L5 pedicles bilaterally under fluoroscopic guidance. We then palpated along the medial aspect of the pedicles to rule out cortical breaches. There were none. The nerve roots were not injured. We then connected the unilateral pedicle screws with a lordotic rod. We compressed the construct and secured the rod in place with the caps. We then tightened the caps appropriately. This completed the instrumentation from L4-5.  We now turned our attention to the posterior lateral arthrodesis at L4-5. We used the high-speed drill to decorticate the remainder of the facets, pars, transverse process at L4-5. We then applied a combination of local morselized autograft bone and Vitoss bone graft extender over these decorticated posterior lateral structures. This completed the posterior lateral arthrodesis.  We then obtained hemostasis using bipolar electrocautery. We irrigated the wound out with bacitracin solution. We inspected the thecal sac and nerve roots and noted they were well decompressed. We then removed the retractor. We placed a medium Hemovac drain in the epidural space and tunneled out through separate stab wound. We reapproximated patient's thoracolumbar fascia with interrupted #1 Vicryl suture. We reapproximated patient's subcutaneous tissue with interrupted 2-0 Vicryl suture. The reapproximated patient's skin with Steri-Strips and benzoin. The wound was then coated with bacitracin ointment. A sterile dressing was applied. The drapes  were removed. The patient was subsequently returned to the supine position where they were extubated by the anesthesia team. He was then transported to the post anesthesia care unit in stable condition. All sponge instrument and needle counts were reportedly correct at the end of this case.

## 2013-05-30 NOTE — Anesthesia Preprocedure Evaluation (Signed)
Anesthesia Evaluation  Patient identified by MRN, date of birth, ID band Patient awake    Reviewed: Allergy & Precautions, H&P , NPO status , Patient's Chart, lab work & pertinent test results  Airway Mallampati: I TM Distance: >3 FB Neck ROM: full    Dental   Pulmonary neg pulmonary ROS,  breath sounds clear to auscultation  Pulmonary exam normal       Cardiovascular Rhythm:regular Rate:Normal     Neuro/Psych  Headaches,  Neuromuscular disease    GI/Hepatic negative GI ROS, Neg liver ROS,   Endo/Other  negative endocrine ROS  Renal/GU negative Renal ROS     Musculoskeletal   Abdominal Normal abdominal exam  (+)   Peds  Hematology   Anesthesia Other Findings   Reproductive/Obstetrics                           Anesthesia Physical Anesthesia Plan  ASA: II  Anesthesia Plan: General   Post-op Pain Management:    Induction: Intravenous  Airway Management Planned: Oral ETT  Additional Equipment:   Intra-op Plan:   Post-operative Plan: Extubation in OR  Informed Consent: I have reviewed the patients History and Physical, chart, labs and discussed the procedure including the risks, benefits and alternatives for the proposed anesthesia with the patient or authorized representative who has indicated his/her understanding and acceptance.     Plan Discussed with: CRNA and Surgeon  Anesthesia Plan Comments:         Anesthesia Quick Evaluation

## 2013-05-30 NOTE — Transfer of Care (Signed)
Immediate Anesthesia Transfer of Care Note  Patient: Rodney Wood  Procedure(s) Performed: Procedure(s): LUMBAR FOUR-FIVE LAMINECTOMY  WITH POSTERIOR LUMBAR INTERBODY FUSION WITH INTERBODY PROSTHESIS POSTERIOR LATERAL ARTHRODESIS AND POSTERIOR NONSEGMENTAL INSTRUMENTATION (N/A)  Patient Location: PACU  Anesthesia Type:General  Level of Consciousness: sedated and patient cooperative  Airway & Oxygen Therapy: Patient Spontanous Breathing and Patient connected to face mask oxygen  Post-op Assessment: Report given to PACU RN and Post -op Vital signs reviewed and stable  Post vital signs: Reviewed and stable  Complications: No apparent anesthesia complications

## 2013-05-31 LAB — BASIC METABOLIC PANEL
BUN: 8 mg/dL (ref 6–23)
Chloride: 102 mEq/L (ref 96–112)
Creatinine, Ser: 0.83 mg/dL (ref 0.50–1.35)
GFR calc Af Amer: >90 mL/min (ref >90)
GFR calc non Af Amer: >90 mL/min (ref >90)
Potassium: 3.7 mEq/L (ref 3.5–5.1)

## 2013-05-31 LAB — CBC
MCHC: 32.4 g/dL (ref 30.0–36.0)
MCV: 94.7 fL (ref 78.0–100.0)
Platelets: 184 10*3/uL (ref 150–400)
RDW: 13.5 % (ref 11.5–15.5)
WBC: 7.3 10*3/uL (ref 4.0–10.5)

## 2013-05-31 MED ORDER — MORPHINE SULFATE 2 MG/ML IJ SOLN
2.0000 mg | INTRAMUSCULAR | Status: DC | PRN
  Administered 2013-05-31 – 2013-06-01 (×2): 2 mg via INTRAVENOUS
  Administered 2013-06-01: 4 mg via INTRAVENOUS
  Administered 2013-06-02 (×3): 2 mg via INTRAVENOUS
  Filled 2013-05-31: qty 1
  Filled 2013-05-31: qty 2
  Filled 2013-05-31 (×4): qty 1

## 2013-05-31 NOTE — Evaluation (Signed)
Physical Therapy Evaluation Patient Details Name: Rodney Wood MRN: [id number] DOB: [birth year] Today's Date: 05/31/2013 Time: 7829-5621 PT Time Calculation (min): 28 min  PT Assessment / Plan / Recommendation History of Present Illness  pt rpesents with L4-5 Lami and PLIF.    Clinical Impression  Pt very painful in R LE and back with mobility.  Pt ed on donning/doffing back brace and back precautions.  Will continue to follow.      PT Assessment  Patient needs continued PT services    Follow Up Recommendations  Home health PT;Supervision/Assistance - 24 hour    Does the patient have the potential to tolerate intense rehabilitation      Barriers to Discharge        Equipment Recommendations  None recommended by PT    Recommendations for Other Services     Frequency Min 5X/week    Precautions / Restrictions Precautions Precautions: Back;Fall Precaution Booklet Issued: Yes (comment) Required Braces or Orthoses: Spinal Brace Spinal Brace: Lumbar corset;Applied in sitting position Restrictions Weight Bearing Restrictions: No   Pertinent Vitals/Pain 8/10 in back and R LE.        Mobility  Bed Mobility Bed Mobility: Rolling Right;Right Sidelying to Sit;Sitting - Scoot to Edge of Bed Rolling Right: 4: Min guard Right Sidelying to Sit: 4: Min assist Sitting - Scoot to Edge of Bed: 5: Supervision Details for Bed Mobility Assistance: cues for log roll and safe technique.   Transfers Transfers: Sit to Stand;Stand to Sit Sit to Stand: 3: Mod assist;With upper extremity assist;From bed;From chair/3-in-1 Stand to Sit: 4: Min assist;With upper extremity assist;To chair/3-in-1 Details for Transfer Assistance: cues for UE use and encouragement.  pt limited by pain in R LE.   Ambulation/Gait Ambulation/Gait Assistance: 1: +2 Total assist Ambulation/Gait: Patient Percentage: 70% Ambulation Distance (Feet): 20 Feet (and 10) Assistive device: 2 person hand held  assist Ambulation/Gait Assistance Details: Bil HHA.  pt with pain in R LE with every step.   Gait Pattern: Step-to pattern;Decreased step length - left;Decreased stance time - right;Decreased stride length;Antalgic Stairs: No Wheelchair Mobility Wheelchair Mobility: No    Exercises     PT Diagnosis: Difficulty walking;Acute pain  PT Problem List: Decreased strength;Decreased activity tolerance;Decreased balance;Decreased mobility;Decreased knowledge of use of DME;Decreased knowledge of precautions;Pain;Impaired sensation PT Treatment Interventions: DME instruction;Gait training;Stair training;Functional mobility training;Therapeutic activities;Therapeutic exercise;Balance training;Neuromuscular re-education;Patient/family education     PT Goals(Current goals can be found in the care plan section) Acute Rehab PT Goals Patient Stated Goal: Move without pain PT Goal Formulation: With patient Time For Goal Achievement: 06/07/13 Potential to Achieve Goals: Good  Visit Information  Last PT Received On: 05/31/13 Assistance Needed: +2 (for safety) PT/OT/SLP Co-Evaluation/Treatment: Yes PT goals addressed during session: Mobility/safety with mobility;Balance History of Present Illness: pt rpesents with L4-5 Lami and PLIF.         Prior Functioning  Home Living Family/patient expects to be discharged to:: Private residence Living Arrangements: Spouse/significant other Available Help at Discharge: Family;Available 24 hours/day (for 2 weeks) Type of Home: House Home Access: Stairs to enter [employer] of Steps: 2 Entrance Stairs-Rails: None Home Layout: Two level;1/2 bath on main level Alternate Level Stairs-Number of Steps: flight Alternate Level Stairs-Rails: Right Home Equipment: Walker - 2 wheels;Bedside commode Prior Function Level of Independence: Independent Communication Communication: No difficulties    Cognition  Cognition Arousal/Alertness:  Awake/alert Behavior During Therapy: WFL for tasks assessed/performed Overall Cognitive Status: Within Functional Limits for tasks assessed    Extremity/Trunk  Assessment Upper Extremity Assessment Upper Extremity Assessment: Defer to OT evaluation Lower Extremity Assessment Lower Extremity Assessment: RLE deficits/detail RLE Deficits / Details: Generally weak with pain shooting down posterior of LE.  RLE: Unable to fully assess due to pain RLE Sensation: decreased light touch   Balance Balance Balance Assessed: Yes Static Standing Balance Static Standing - Balance Support: Bilateral upper extremity supported Static Standing - Level of Assistance: 4: Min assist Static Standing - Comment/# of Minutes: With Bil UE support pt able to stand at sink with MinG, however when attempting unilateral UE support, pt unable to maintain balance.    End of Session PT - End of Session Equipment Utilized During Treatment: Gait belt;Back brace Activity Tolerance: Patient limited by pain Patient left: in chair;with call bell/phone within reach;with family/visitor present Nurse Communication: Mobility status  GP     Sunny Schlein, [place] [phone] 05/31/2013, 2:05 PM

## 2013-05-31 NOTE — Progress Notes (Signed)
UR complete.  Ralonda Tartt RN, MSN 

## 2013-05-31 NOTE — Progress Notes (Signed)
Orthopedic Tech Progress Note Patient Details:  RIDLEY SCHEWE February 28, 1961 [id number] Brace delivered.  Patient ID: DEMAREON COLDWELL, male   DOB: 1961-03-11, 52 y.o.   MRN: [id number]   Orie Rout 05/31/2013, 11:08 AM

## 2013-05-31 NOTE — Evaluation (Signed)
Occupational Therapy Evaluation Patient Details Name: Rodney Wood MRN: [id number] DOB: 11-Nov-1960 Today's Date: 05/31/2013 Time: 4098-1191 OT Time Calculation (min): 27 min  OT Assessment / Plan / Recommendation History of present illness pt presents with L4-5 Lami and PLIF.     Clinical Impression   Patient is s/p PLIF L4-5 surgery resulting in functional limitations due to the deficits listed below (see OT problem list).  Patient will benefit from skilled OT acutely to increase independence and safety with ADLS to allow discharge HHOT.     OT Assessment  Patient needs continued OT Services    Follow Up Recommendations  Home health OT    Barriers to Discharge      Equipment Recommendations  None recommended by OT    Recommendations for Other Services    Frequency  Min 2X/week    Precautions / Restrictions Precautions Precautions: Back;Fall Precaution Booklet Issued: Yes (comment) Precaution Comments: handout provided Required Braces or Orthoses: Spinal Brace Spinal Brace: Lumbar corset;Applied in sitting position Restrictions Weight Bearing Restrictions: No   Pertinent Vitals/Pain Severe pain Rt LE 8 out 10    ADL  Eating/Feeding: Modified independent Where Assessed - Eating/Feeding: Bed level Grooming: Wash/dry hands;Wash/dry face;Teeth care;Min guard Where Assessed - Grooming: Unsupported sitting (sitting on 3n1 at sink level) Upper Body Dressing: Minimal assistance Where Assessed - Upper Body Dressing: Unsupported sitting (don brace and educated on proper fit) Toilet Transfer: Moderate assistance Toilet Transfer Method: Sit to stand Toilet Transfer Equipment: Raised toilet seat with arms (or 3-in-1 over toilet) Toileting - Clothing Manipulation and Hygiene: Moderate assistance Where Assessed - Toileting Clothing Manipulation and Hygiene: Sit to stand from 3-in-1 or toilet Equipment Used: Gait belt;Back brace;Other (comment) (hand held  (A)) Transfers/Ambulation Related to ADLs: Pt required bil UE hand held (A) with OT  and PT. pt heavily weight bearing into OT hands. Recommend RW next session ADL Comments: Pt supine on arrival and brace present in room. Ot called ortho tech at 8:30 AM to request brace. Pt required (A) and education on bed mobility. Pt sitting at EOB and (A) with don of brace. Pt ambulating to restroom to void bladder and sink level grooming. pt required heavily on bil UE. pt needed cues to avoid bending and twisting. Pt unable to recall 3 back precuations and teach back used during session multiple times. Wife present and states "why dont you know them yet? you need to learn this" Pt unable to static stand for oral care. Pt positioned in chair. Pt and wife educated on the need for OOB to chair for all meals.     OT Diagnosis: Generalized weakness;Acute pain  OT Problem List: Decreased strength;Decreased activity tolerance;Impaired balance (sitting and/or standing);Decreased safety awareness;Decreased knowledge of use of DME or AE;Decreased knowledge of precautions;Pain OT Treatment Interventions: Self-care/ADL training;DME and/or AE instruction;Therapeutic activities;Patient/family education;Balance training   OT Goals(Current goals can be found in the care plan section) Acute Rehab OT Goals Patient Stated Goal: Move without pain OT Goal Formulation: With patient Time For Goal Achievement: 06/14/13 Potential to Achieve Goals: Good  Visit Information  Last OT Received On: 05/31/13 Assistance Needed: +2 (for safety) PT/OT/SLP Co-Evaluation/Treatment: Yes PT goals addressed during session: Mobility/safety with mobility;Balance OT goals addressed during session: ADL's and self-care History of Present Illness: pt presents with L4-5 Lami and PLIF.         Prior Functioning     Home Living Family/patient expects to be discharged to:: Private residence Living Arrangements: Spouse/significant other Available  Help at Discharge: Family;Available 24 hours/day (for 2 weeks) Type of Home: House Home Access: Stairs to enter [employer] of Steps: 2 Entrance Stairs-Rails: None Home Layout: Two level;1/2 bath on main level Alternate Level Stairs-Number of Steps: flight Alternate Level Stairs-Rails: Right Home Equipment: Walker - 2 wheels;Bedside commode Prior Function Level of Independence: Independent Communication Communication: No difficulties Dominant Hand: Right         Vision/Perception Vision - History Baseline Vision: No visual deficits Patient Visual Report: No change from baseline   Cognition  Cognition Arousal/Alertness: Awake/alert Behavior During Therapy: WFL for tasks assessed/performed Overall Cognitive Status: Within Functional Limits for tasks assessed    Extremity/Trunk Assessment Upper Extremity Assessment Upper Extremity Assessment: Overall WFL for tasks assessed Lower Extremity Assessment Lower Extremity Assessment: Defer to PT evaluation RLE Deficits / Details: Generally weak with pain shooting down posterior of LE.  RLE: Unable to fully assess due to pain RLE Sensation: decreased light touch Cervical / Trunk Assessment Cervical / Trunk Assessment: Normal     Mobility Bed Mobility Bed Mobility: Rolling Right;Right Sidelying to Sit;Sitting - Scoot to Edge of Bed Rolling Right: 4: Min guard Right Sidelying to Sit: 4: Min assist Sitting - Scoot to Edge of Bed: 5: Supervision Details for Bed Mobility Assistance: cues for log roll and safe technique.   Transfers Transfers: Stand to Sit;Sit to Stand Sit to Stand: 3: Mod assist;With upper extremity assist;From bed;From chair/3-in-1 Stand to Sit: 4: Min assist;With upper extremity assist;To chair/3-in-1 Details for Transfer Assistance: cues for UE use and encouragement.  pt limited by pain in R LE.       Exercise     Balance Balance Balance Assessed: Yes Static Standing Balance Static Standing -  Balance Support: Bilateral upper extremity supported Static Standing - Level of Assistance: 4: Min assist Static Standing - Comment/# of Minutes: Bil UE support at sink level    End of Session OT - End of Session Activity Tolerance: Patient limited by pain Patient left: in chair;with call bell/phone within reach;with family/visitor present Nurse Communication: Mobility status;Precautions  GO     Harolyn Rutherford 05/31/2013, 2:34 PM Pager: [phone]

## 2013-05-31 NOTE — Progress Notes (Signed)
Patient ID: Rodney Wood, male   DOB: 1961/03/23, 52 y.o.   MRN: [id number] Subjective:  The patient is alert and pleasant. He looks well. His back is approximately sore.  Objective: Vital signs in last 24 hours: Temp:  [97.1 F (36.2 C)-98.6 F (37 C)] 98.6 F (37 C) (12/02 0530) Pulse Rate:  [66-93] 85 (12/02 0530) Resp:  [2-21] 18 (12/02 0530) BP: (126-150)/(69-101) 142/86 mmHg (12/02 0530) SpO2:  [97 %-100 %] 99 % (12/02 0530) FiO2 (%):  [97 %] 97 % (12/02 0332)  Intake/Output from previous day: 12/01 0701 - 12/02 0700 In: 2367.5 [P.O.:60; I.V.:1907.5; IV Piggyback:100] Out: 2780 [Urine:2400; Drains:180; Blood:200] Intake/Output this shift:    Physical exam the patient is alert and oriented. His strength is normal his lower extremities.  Lab Results:  Recent Labs  05/31/13 0545  WBC 7.3  HGB 12.1*  HCT 37.3*  PLT 184   BMET  Recent Labs  05/31/13 0545  NA 138  K 3.7  CL 102  CO2 27  GLUCOSE 114*  BUN 8  CREATININE 0.83  CALCIUM 8.6    Studies/Results: Dg Lumbar Spine 2-3 Views  05/30/2013   CLINICAL DATA:  L4-5 posterior fixation.  EXAM: DG C-ARM 1-60 MIN; LUMBAR SPINE - 2-3 VIEW  COMPARISON:  MRI 08/09/2012  FINDINGS: AP and lateral views. These demonstrate trans pedicle screw fixation at the L4-5 level. No acute hardware complication.  IMPRESSION: Intraoperative imaging of L4-5 fixation.   Electronically Signed   By: Jeronimo Greaves M.D.   On: 05/30/2013 17:17   Dg Lumbar Spine 1 View  05/30/2013   CLINICAL DATA:  Lumbar disc disease.  EXAM: LUMBAR SPINE - 1 VIEW  COMPARISON:  MRI dated 08/09/2012  FINDINGS: Single lateral view of the lumbar spine demonstrates an instrument at the level of the pedicles of L4.  IMPRESSION: Instrument at the level of the pedicles of L4.   Electronically Signed   By: Geanie Cooley M.D.   On: 05/30/2013 15:28   Dg C-arm 1-60 Min  05/30/2013   CLINICAL DATA:  L4-5 posterior fixation.  EXAM: DG C-ARM 1-60 MIN; LUMBAR SPINE - 2-3  VIEW  COMPARISON:  MRI 08/09/2012  FINDINGS: AP and lateral views. These demonstrate trans pedicle screw fixation at the L4-5 level. No acute hardware complication.  IMPRESSION: Intraoperative imaging of L4-5 fixation.   Electronically Signed   By: Jeronimo Greaves M.D.   On: 05/30/2013 17:17    Assessment/Plan: Postop day #1: The patient is doing well. We will mobilize him with PT and OT. We will discontinue his PCA, Foley catheter, Hep-Lock his IV, etc.  LOS: 1 day     Trinadee Verhagen D 05/31/2013, 7:53 AM

## 2013-06-01 MED ORDER — GABAPENTIN 600 MG PO TABS
600.0000 mg | ORAL_TABLET | Freq: Three times a day (TID) | ORAL | Status: DC
  Administered 2013-06-01 – 2013-06-04 (×9): 600 mg via ORAL
  Filled 2013-06-01 (×11): qty 1

## 2013-06-01 MED FILL — Sodium Chloride IV Soln 0.9%: INTRAVENOUS | Qty: 1000 | Status: AC

## 2013-06-01 MED FILL — Heparin Sodium (Porcine) Inj 1000 Unit/ML: INTRAMUSCULAR | Qty: 30 | Status: AC

## 2013-06-01 NOTE — Evaluation (Signed)
Occupational Therapy Evaluation Patient Details Name: Rodney Wood MRN: [id number] DOB: May 29, 1961 Today's Date: 06/01/2013 Time: 0865-7846 OT Time Calculation (min): 19 min  OT Assessment / Plan / Recommendation History of present illness pt presents with L4-5 Lami and PLIF.     Clinical Impression   Pt progressing with OT slowly and has severe pain in RT LE. Pt and wife agreeable to CIR consult at this time. Pt can not d/c home at current level of care. Pt relies heavily on RW and needs to be able to complete transfers MOD I upon d/c home.    OT Assessment       Follow Up Recommendations  CIR;Supervision/Assistance - 24 hour (unable to transfer without (A))    Barriers to Discharge      Equipment Recommendations  None recommended by OT    Recommendations for Other Services Rehab consult  Frequency  Min 2X/week    Precautions / Restrictions Precautions Precautions: Back;Fall Precaution Comments: pt recalled 2/3 back precautions.  Reviewed precautions.   Required Braces or Orthoses: Spinal Brace Spinal Brace: Lumbar corset;Applied in sitting position Restrictions Weight Bearing Restrictions: No   Pertinent Vitals/Pain Severe RT LE pain Pt sweating due to pain / strength required to use BIL UE on RW    ADL  Grooming: Teeth care;Set up Where Assessed - Grooming: Unsupported sitting (required sitting position due to pain) Lower Body Dressing: +1 Total assistance Where Assessed - Lower Body Dressing: Supported sit to stand Toilet Transfer: Moderate assistance Toilet Transfer Method: Sit to stand Toilet Transfer Equipment: Raised toilet seat with arms (or 3-in-1 over toilet) Toileting - Clothing Manipulation and Hygiene: Minimal assistance Where Assessed - Toileting Clothing Manipulation and Hygiene: Sit to stand from 3-in-1 or toilet Equipment Used: Gait belt;Back brace;Rolling walker Transfers/Ambulation Related to ADLs: Pt requires Min (A) and relies heavily on RW for  support. Pt pushing down on RW to the point that his knuckles are white. Pt could not complete transfer without RW ADL Comments: Pt completing bed mobility this session with use of Rail. pt attempted static standing for oral care. Pt is unable to tolerate static standing position. Pt requires sitting for all adls. Pt progressing slowly with severe RT LE pain. Pt does not feel he can d/c home at this level safely. Pt limited by pain at this time but continues to work hard with therapy.    OT Diagnosis:    OT Problem List:   OT Treatment Interventions:     OT Goals(Current goals can be found in the care plan section) Acute Rehab OT Goals Patient Stated Goal: Move without pain OT Goal Formulation: With patient Time For Goal Achievement: 06/14/13 Potential to Achieve Goals: Good ADL Goals Pt Will Perform Grooming: with modified independence;standing Pt Will Perform Upper Body Bathing: with supervision;standing Pt Will Perform Lower Body Bathing: with supervision;sit to/from stand;with adaptive equipment Pt Will Perform Upper Body Dressing: with supervision;with adaptive equipment;standing Pt Will Perform Lower Body Dressing: with supervision;with adaptive equipment;sit to/from stand Pt Will Transfer to Toilet: with supervision;bedside commode Additional ADL Goal #1: Pt will complete bed mobilty MOD I without bed rails  Visit Information  Last OT Received On: 06/01/13 Assistance Needed: +1 History of Present Illness: pt presents with L4-5 Lami and PLIF.         Prior Functioning               Vision/Perception     Cognition  Cognition Arousal/Alertness: Awake/alert Behavior During Therapy: WFL for tasks  assessed/performed Overall Cognitive Status: Within Functional Limits for tasks assessed    Extremity/Trunk Assessment       Mobility Bed Mobility Bed Mobility: Rolling Right;Right Sidelying to Sit;Supine to Sit;Sitting - Scoot to Edge of Bed Rolling Right: 4: Min  guard;With rail Right Sidelying to Sit: 4: Min guard;With rails;HOB elevated Supine to Sit: 4: Min guard;With rails;HOB elevated Sitting - Scoot to Edge of Bed: 4: Min guard;With rail Sit to Sidelying Right: 5: Supervision;HOB elevated Details for Bed Mobility Assistance: cues for safety Transfers Transfers: Sit to Stand;Stand to Sit Sit to Stand: 4: Min assist;With upper extremity assist;From bed Stand to Sit: 4: Min assist;With upper extremity assist;To chair/3-in-1 Details for Transfer Assistance: cues for hand placement and safety     Exercise     Balance Balance Balance Assessed: No   End of Session OT - End of Session Activity Tolerance: Patient limited by pain Patient left: in chair;with call bell/phone within reach;with family/visitor present Nurse Communication: Mobility status;Precautions  GO     Boone Master B 06/01/2013, 11:14 AM Pager: [phone]

## 2013-06-01 NOTE — Plan of Care (Signed)
Problem: Consults Goal: Diagnosis - Spinal Surgery Thoraco/Lumbar Spine Fusion     

## 2013-06-01 NOTE — Progress Notes (Signed)
Patient ID: Rodney Wood, male   DOB: 29-Nov-1960, 52 y.o.   MRN: [id number] Subjective:  The patient is alert and pleasant. His back is approximately sore. He complains of some right leg pain when he stands.  Objective: Vital signs in last 24 hours: Temp:  [98.6 F (37 C)-99.4 F (37.4 C)] 99 F (37.2 C) (12/03 1426) Pulse Rate:  [87-100] 95 (12/03 1426) Resp:  [18-20] 20 (12/03 1426) BP: (131-151)/(80-90) 151/85 mmHg (12/03 1426) SpO2:  [95 %-98 %] 97 % (12/03 1426) Weight:  [98.249 kg (216 lb 9.6 oz)] 98.249 kg (216 lb 9.6 oz) (12/03 1236)  Intake/Output from previous day: 12/02 0701 - 12/03 0700 In: 540 [P.O.:540] Out: -  Intake/Output this shift: Total I/O In: 720 [P.O.:720] Out: -   Physical exam the patient is alert and oriented. His strength is normal in his bilateral quadriceps gastrocnemius and extensor hallucis longus. Examination of the lower extremity demonstrates no swelling, edema, deformity, etc.  Lab Results:  Recent Labs  05/31/13 0545  WBC 7.3  HGB 12.1*  HCT 37.3*  PLT 184   BMET  Recent Labs  05/31/13 0545  NA 138  K 3.7  CL 102  CO2 27  GLUCOSE 114*  BUN 8  CREATININE 0.83  CALCIUM 8.6    Studies/Results: No results found.  Assessment/Plan: Postop day #2: We will continue to mobilize the patient with PT and OT. I'll add Neurontin for his radicular pain. This should resolve with time.  LOS: 2 days     Kamerin Grumbine D 06/01/2013, 4:23 PM

## 2013-06-01 NOTE — Progress Notes (Addendum)
Physical Therapy Treatment Patient Details Name: Rodney Wood MRN: [id number] DOB: 01-24-1961 Today's Date: 06/01/2013 Time: 4098-1191 PT Time Calculation (min): 18 min  PT Assessment / Plan / Recommendation  History of Present Illness pt presents with L4-5 Lami and PLIF.     PT Comments   Pt continues to be very painful, which limits mobility.  Pt able to increase ambulation distance today and decrease amount of A needed for mobility.  At this point pt feels he may need more rehab prior to return to home.  Would benefit from CIR at D/C to maximize Independence prior to return to home.  Will continue to follow.    Follow Up Recommendations  CIR     Does the patient have the potential to tolerate intense rehabilitation     Barriers to Discharge        Equipment Recommendations  None recommended by PT    Recommendations for Other Services    Frequency Min 5X/week   Progress towards PT Goals Progress towards PT goals: Progressing toward goals  Plan Discharge plan needs to be updated    Precautions / Restrictions Precautions Precautions: Back;Fall Precaution Comments: pt recalled 2/3 back precautions.  Reviewed precautions.   Required Braces or Orthoses: Spinal Brace Spinal Brace: Lumbar corset;Applied in sitting position Restrictions Weight Bearing Restrictions: No   Pertinent Vitals/Pain 8-9/10 with activity.  Premedicated per pt.      Mobility  Bed Mobility Bed Mobility: Sit to Sidelying Right Sit to Sidelying Right: 5: Supervision;HOB elevated Details for Bed Mobility Assistance: cues to minimize twisting during return to bed.   Transfers Transfers: Sit to Stand;Stand to Sit Sit to Stand: 4: Min assist;With upper extremity assist;From chair/3-in-1 Stand to Sit: 4: Min guard;With upper extremity assist;To bed Details for Transfer Assistance: cues for UE use and controlling descent to chair.   Ambulation/Gait Ambulation/Gait Assistance: 4: Min assist Ambulation  Distance (Feet): 80 Feet Assistive device: Rolling walker Ambulation/Gait Assistance Details: pt continues very painful during mobility.  pt relies heavily on RW 2/2 pain.   Gait Pattern: Step-through pattern;Decreased stride length Stairs: No Wheelchair Mobility Wheelchair Mobility: No    Exercises     PT Diagnosis:    PT Problem List:   PT Treatment Interventions:     PT Goals (current goals can now be found in the care plan section) Acute Rehab PT Goals Patient Stated Goal: Move without pain Time For Goal Achievement: 06/07/13 Potential to Achieve Goals: Good  Visit Information  Last PT Received On: 06/01/13 Assistance Needed: +1 History of Present Illness: pt presents with L4-5 Lami and PLIF.      Subjective Data  Patient Stated Goal: Move without pain   Cognition  Cognition Arousal/Alertness: Awake/alert Behavior During Therapy: WFL for tasks assessed/performed Overall Cognitive Status: Within Functional Limits for tasks assessed    Balance  Balance Balance Assessed: No  End of Session PT - End of Session Equipment Utilized During Treatment: Gait belt;Back brace Activity Tolerance: Patient limited by pain Patient left: in bed;with call bell/phone within reach Nurse Communication: Mobility status   GP     Sunny Schlein, [place] [phone] 06/01/2013, 11:09 AM

## 2013-06-01 NOTE — Progress Notes (Signed)
Rehab Admissions Coordinator Note:  Patient was screened by Clois Dupes for appropriateness for an Inpatient Acute Rehab Consult. P.T. And O.T. Recommending an inpt rehab stay. Doubt [employer] will approve an inpt rehab stay for this diagnosis. At this time, we are recommending Skilled Nursing Facility if pt does not progress due to his pain.  Clois Dupes 06/01/2013, 1:47 PM  I can be reached at [phone].

## 2013-06-02 NOTE — Progress Notes (Signed)
Agree with SPTA.    Liandra Mendia, PT [phone]  

## 2013-06-02 NOTE — Progress Notes (Signed)
Patient ID: Rodney Wood, male   DOB: 09-May-1961, 52 y.o.   MRN: [id number] Subjective:  The patient is alert and pleasant. He looks well. His right leg pain has improved.  Objective: Vital signs in last 24 hours: Temp:  [98.2 F (36.8 C)-99.6 F (37.6 C)] 98.7 F (37.1 C) (12/04 1700) Pulse Rate:  [88-102] 90 (12/04 1700) Resp:  [18-20] 18 (12/04 1700) BP: (124-153)/(79-94) 153/83 mmHg (12/04 1700) SpO2:  [96 %-98 %] 97 % (12/04 1700)  Intake/Output from previous day: 12/03 0701 - 12/04 0700 In: 720 [P.O.:720] Out: -  Intake/Output this shift:    Physical exam patient is alert and oriented. His strength is normal.  Lab Results:  Recent Labs  05/31/13 0545  WBC 7.3  HGB 12.1*  HCT 37.3*  PLT 184   BMET  Recent Labs  05/31/13 0545  NA 138  K 3.7  CL 102  CO2 27  GLUCOSE 114*  BUN 8  CREATININE 0.83  CALCIUM 8.6    Studies/Results: No results found.  Assessment/Plan: Postop day #3: The patient is doing well we will likely discharge him tomorrow.  LOS: 3 days     Debhora Titus D 06/02/2013, 7:07 PM

## 2013-06-02 NOTE — Progress Notes (Signed)
Physical Therapy Treatment Patient Details Name: Rodney Wood MRN: [id number] DOB: [birth year] Today's Date: 06/02/2013 Time: 4098-1191 PT Time Calculation (min): 26 min  PT Assessment / Plan / Recommendation  History of Present Illness pt presents with L4-5 Lami and PLIF.     PT Comments   Pt progressing with bed mobility, transfers, and ambulation today however limited due to pain. Pt required standing rest breaks; able to ambulate his household distances and demonstrated other mobility required for home.  Pt stated that he will be going home; wife has taken off next 3-4 weeks and will be with him 24hours as she has been with previous two surgeries. Recommended that pt attempt stair training prior to d/c home (2 stairs to enter house and will be staying downstairs). Pt would benefit from HHPT to increase activity tolerance and maximize functional I.   Follow Up Recommendations  Home health PT;Supervision for mobility/OOB     Does the patient have the potential to tolerate intense rehabilitation     Barriers to Discharge        Equipment Recommendations  None recommended by PT    Recommendations for Other Services    Frequency Min 5X/week   Progress towards PT Goals Progress towards PT goals: Progressing toward goals  Plan Discharge plan needs to be updated    Precautions / Restrictions Precautions Precautions: Back;Fall Precaution Booklet Issued: Yes (comment) Precaution Comments: pt recalled 2/3 back precautions.  Reviewed precautions.   Required Braces or Orthoses: Spinal Brace Spinal Brace: Lumbar corset;Applied in sitting position Restrictions Weight Bearing Restrictions: No   Pertinent Vitals/Pain 6/10 RLE and LBP; pt denied request for meds    Mobility  Bed Mobility Bed Mobility: Rolling Right;Right Sidelying to Sit;Sitting - Scoot to [place] of Bed Rolling Right: 4: Min guard Right Sidelying to Sit: 4: Min guard Sitting - Scoot to [place] of Bed: 5:  Supervision Details for Bed Mobility Assistance: Pt demonstrated bed mobility with HOB flat and without rail; with cues for log roll technique Transfers Transfers: Sit to Stand;Stand to Sit Sit to Stand: With upper extremity assist;From bed;From chair/3-in-1;4: Min guard Stand to Sit: 4: Min guard;To bed;To chair/3-in-1;With upper extremity assist Details for Transfer Assistance: cues for hand placement and safety Ambulation/Gait Ambulation/Gait Assistance: 4: Min guard Ambulation Distance (Feet): 130 Feet Assistive device: Rolling walker Ambulation/Gait Assistance Details: Pt continues to have increased pain with ambulation and relies heavily on RW; cues to relax shoulders and for longer step length Gait Pattern: Step-through pattern;Decreased stride length;Shuffle Gait velocity: decreased    Exercises General Exercises - Lower Extremity Ankle Circles/Pumps: AROM;10 reps;Both   PT Diagnosis:    PT Problem List:   PT Treatment Interventions:     PT Goals (current goals can now be found in the care plan section)    Visit Information  Last PT Received On: 06/02/13 Assistance Needed: +1 History of Present Illness: pt presents with L4-5 Lami and PLIF.      Subjective Data      Cognition  Cognition Arousal/Alertness: Awake/alert Behavior During Therapy: WFL for tasks assessed/performed Overall Cognitive Status: Within Functional Limits for tasks assessed    Balance     End of Session PT - End of Session Equipment Utilized During Treatment: Gait belt;Back brace Activity Tolerance: Patient limited by pain Patient left: in chair;with nursing/sitter in room;with call bell/phone within reach Nurse Communication: Mobility status   GP     Ernestina Columbia, SPTA 06/02/2013, 8:56 AM

## 2013-06-03 MED ORDER — DSS 100 MG PO CAPS: 100.0000 mg | ORAL_CAPSULE | Freq: Two times a day (BID) | ORAL | Status: DC

## 2013-06-03 MED ORDER — POLYETHYLENE GLYCOL 3350 17 G PO PACK
17.0000 g | PACK | Freq: Every day | ORAL | Status: DC
  Administered 2013-06-03 – 2013-06-04 (×2): 17 g via ORAL
  Filled 2013-06-03 (×2): qty 1

## 2013-06-03 MED ORDER — OXYCODONE-ACETAMINOPHEN 10-325 MG PO TABS: 1.0000 | ORAL_TABLET | ORAL | Status: DC | PRN

## 2013-06-03 MED ORDER — GABAPENTIN 600 MG PO TABS: 600.0000 mg | ORAL_TABLET | Freq: Three times a day (TID) | ORAL | Status: DC

## 2013-06-03 MED ORDER — DIAZEPAM 5 MG PO TABS: 5.0000 mg | ORAL_TABLET | Freq: Four times a day (QID) | ORAL | Status: DC | PRN

## 2013-06-03 NOTE — Progress Notes (Signed)
UR complete.  Oney Folz RN, MSN 

## 2013-06-03 NOTE — Progress Notes (Signed)
Physical Therapy Treatment Patient Details Name: Rodney Wood MRN: [id number] DOB: 01-26-1961 Today's Date: 06/03/2013 Time: 4098-1191 PT Time Calculation (min): 22 min  PT Assessment / Plan / Recommendation  History of Present Illness pt presents with L4-5 Lami and PLIF.     PT Comments   Pt making great progress and ready to D/C from PT standpoint.    Follow Up Recommendations  Home health PT;Supervision for mobility/OOB     Does the patient have the potential to tolerate intense rehabilitation     Barriers to Discharge        Equipment Recommendations  None recommended by PT    Recommendations for Other Services    Frequency Min 5X/week   Progress towards PT Goals Progress towards PT goals: Progressing toward goals  Plan Current plan remains appropriate    Precautions / Restrictions Precautions Precautions: Back;Fall Precaution Comments: pt recalled 3/3 back precautions.  Reviewed precautions.   Required Braces or Orthoses: Spinal Brace Spinal Brace: Lumbar corset;Applied in sitting position Restrictions Weight Bearing Restrictions: No   Pertinent Vitals/Pain 4/10 in low back.  Pt indicated premedicated this am.      Mobility  Bed Mobility Bed Mobility: Not assessed Transfers Transfers: Sit to Stand;Stand to Sit Sit to Stand: 5: Supervision;With upper extremity assist;From chair/3-in-1 Stand to Sit: 5: Supervision;With upper extremity assist;To chair/3-in-1 Details for Transfer Assistance: demos good technique.   Ambulation/Gait Ambulation/Gait Assistance: 4: Min guard Ambulation Distance (Feet): 200 Feet Assistive device: Rolling walker Ambulation/Gait Assistance Details: pt continues to rely heavily on UEs despite cueing to increase WBing in LEs and relax Upper body.   Gait Pattern: Step-through pattern;Decreased stride length;Shuffle Stairs: Yes Stairs Assistance: 4: Min assist Stairs Assistance Details (indicate cue type and reason): cues for safe  technique with RW.   Stair Management Technique: No rails;Backwards Number of Stairs: 2 Wheelchair Mobility Wheelchair Mobility: No    Exercises     PT Diagnosis:    PT Problem List:   PT Treatment Interventions:     PT Goals (current goals can now be found in the care plan section) Acute Rehab PT Goals Patient Stated Goal: Move without pain Time For Goal Achievement: 06/07/13 Potential to Achieve Goals: Good  Visit Information  Last PT Received On: 06/03/13 Assistance Needed: +1 History of Present Illness: pt presents with L4-5 Lami and PLIF.      Subjective Data  Patient Stated Goal: Move without pain   Cognition  Cognition Arousal/Alertness: Awake/alert Behavior During Therapy: WFL for tasks assessed/performed Overall Cognitive Status: Within Functional Limits for tasks assessed    Balance     End of Session PT - End of Session Equipment Utilized During Treatment: Gait belt;Back brace Activity Tolerance: Patient tolerated treatment well Patient left: in chair;with call bell/phone within reach Nurse Communication: Mobility status   GP     Sunny Schlein, [place] [phone] 06/03/2013, 9:01 AM

## 2013-06-03 NOTE — Progress Notes (Signed)
Patient ID: Rodney Wood, male   DOB: Jul 01, 1960, 52 y.o.   MRN: [id number] Subjective:  The patient is alert and pleasant. He wants to go home tomorrow.  Objective: Vital signs in last 24 hours: Temp:  [97.9 F (36.6 C)-98.8 F (37.1 C)] 97.9 F (36.6 C) (12/05 0515) Pulse Rate:  [87-102] 99 (12/05 0515) Resp:  [18] 18 (12/05 0515) BP: (124-153)/(83-94) 139/93 mmHg (12/05 0515) SpO2:  [95 %-98 %] 97 % (12/05 0515)  Intake/Output from previous day:   Intake/Output this shift:    Physical exam patient is alert and oriented. His wound is healing well without signs of infection. His strength is normal in his lower extremities.  Lab Results: No results found for this basename: WBC, HGB, HCT, PLT,  in the last 72 hours BMET No results found for this basename: NA, K, CL, CO2, GLUCOSE, BUN, CREATININE, CALCIUM,  in the last 72 hours  Studies/Results: No results found.  Assessment/Plan: Postop day 4: We will plan to discharge the patient tomorrow. I gave him his discharge instructions and answered all his questions.  LOS: 4 days     Rodney Wood D 06/03/2013, 7:52 AM

## 2013-06-04 MED ORDER — INFLUENZA VAC SPLIT QUAD 0.5 ML IM SUSP: 0.5000 mL | INTRAMUSCULAR | Status: DC

## 2013-06-04 NOTE — Discharge Summary (Signed)
Physician Discharge Summary  Patient ID: Rodney Wood MRN: [id number] DOB/AGE: 54-Mar-1962 54 y.o.  Admit date: 05/30/2013 Discharge date: 06/04/2013  Admission Diagnoses:L4-5 spondylolisthesis, Degenerative disc disease, spinal stenosis compressing both the L4 and the L5 nerve roots; lumbago; lumbar radiculopathy   Discharge Diagnoses: L4-5 spondylolisthesis, Degenerative disc disease, spinal stenosis compressing both the L4 and the L5 nerve roots; lumbago; lumbar radiculopathy  Active Problems:   Spondylolisthesis of lumbar region   Discharged Condition: good  Hospital Course: Mr. Gunnoe was admitted to the hospital and underwent a lumbar fusion for low back and lower extremity pain. Post op he is ambulating, voiding and tolerAting a regular diet. His strength is normal. The wound is cleAn, dry, and without signs of infection.   Consults: None  Significant Diagnostic Studies: none  Treatments: surgery: Redo L4 laminectomy and foraminotomies to decompress the bilateral L4 and L5 nerve roots(the work required to do this was in addition to the work required to do the posterior lumbar interbody fusion because of the patient's spinal stenosis, facet arthropathy. Etc. requiring a wide decompression of the nerve roots.); L4-5 posterior lumbar interbody fusion with local morselized autograft bone and Actifusebone graft extender; insertion of interbody prosthesis at L4-5 (globus peek interbody prosthesis); posterior nonsegmental instrumentation from L4 to L5 with globus titanium pedicle screws and rods; posterior lateral arthrodesis at L4-5 with local morselized autograft bone and Vitoss bone graft extender.   Discharge Exam: Blood pressure 139/92, pulse 79, temperature 98.1 F (36.7 C), temperature source Oral, resp. rate 18, height 5\' 6"  (1.676 m), weight 98.249 kg (216 lb 9.6 oz), SpO2 98.00%. General appearance: alert, cooperative and appears stated age Neurologic: Alert and oriented X 3,  normal strength and tone. Normal symmetric reflexes. Normal coordination and gait  Disposition: 01-Home or Self Care     Medication List    STOP taking these medications       oxyCODONE-acetaminophen 7.5-325 MG per tablet  Commonly known as:  PERCOCET  Replaced by:  oxyCODONE-acetaminophen 10-325 MG per tablet      TAKE these medications       diazepam 5 MG tablet  Commonly known as:  VALIUM  Take 1 tablet (5 mg total) by mouth every 6 (six) hours as needed for muscle spasms.     DSS 100 MG Caps  Take 100 mg by mouth 2 (two) times daily.     gabapentin 600 MG tablet  Commonly known as:  NEURONTIN  Take 1 tablet (600 mg total) by mouth 3 (three) times daily.     oxyCODONE-acetaminophen 10-325 MG per tablet  Commonly known as:  PERCOCET  Take 1 tablet by mouth every 4 (four) hours as needed for pain.           Follow-up Information   Follow up with Cristi Loron, MD In 3 weeks. (call to make appointment in 3 weeks)    Specialty:  Neurosurgery   Contact information:   [address] [place] [address] [phone]       Signed: Cindy Brindisi L 06/04/2013, 9:54 AM

## 2013-06-04 NOTE — Progress Notes (Signed)
Pt discharged home to self care d/c instruction and follow up , incision care and pain management discussed with pt . Back precaution re enforced and both pt and spouse verbalized good understanding

## 2013-06-04 NOTE — Progress Notes (Signed)
Incision clean and dry and condition at d/c was  Stable.pt stated had D ME rolling walker at home and does not need any equipment.

## 2013-06-04 NOTE — Progress Notes (Signed)
Physical Therapy Treatment Patient Details Name: Rodney Wood MRN: [id number] DOB: 07-Dec-1960 Today's Date: 06/04/2013 Time: 0850-0906 PT Time Calculation (min): 16 min  PT Assessment / Plan / Recommendation  History of Present Illness pt presents with L4-5 Lami and PLIF.     PT Comments   Patient stable with mobility at this time. Practiced stair training again this morning with correct technique. Eager to Dc home today  Follow Up Recommendations  Home health PT;Supervision for mobility/OOB     Does the patient have the potential to tolerate intense rehabilitation     Barriers to Discharge        Equipment Recommendations  None recommended by PT    Recommendations for Other Services    Frequency Min 5X/week   Progress towards PT Goals Progress towards PT goals: Progressing toward goals  Plan Current plan remains appropriate    Precautions / Restrictions Precautions Precautions: Back;Fall Precaution Comments: pt recalled 3/3 back precautions.  Reviewed precautions.   Required Braces or Orthoses: Spinal Brace Spinal Brace: Lumbar corset;Applied in sitting position Restrictions Weight Bearing Restrictions: No   Pertinent Vitals/Pain no apparent distress     Mobility  Bed Mobility Rolling Right: 5: Supervision Right Sidelying to Sit: Not tested (comment) Details for Bed Mobility Assistance: Pt demonstrated bed mobility with HOB flat and without rail Transfers Sit to Stand: 5: Supervision;With upper extremity assist;From bed Stand to Sit: 5: Supervision;With upper extremity assist;To bed Details for Transfer Assistance: demos good technique.   Ambulation/Gait Ambulation/Gait Assistance: 5: Supervision Ambulation Distance (Feet): 200 Feet Assistive device: Rolling walker Ambulation/Gait Assistance Details: Continues with decreased speed and reliance on RW Gait Pattern: Step-through pattern;Decreased stride length;Shuffle Gait velocity: decreased Stair Management  Technique: No rails;Backwards Number of Stairs: 2    Exercises     PT Diagnosis:    PT Problem List:   PT Treatment Interventions:     PT Goals (current goals can now be found in the care plan section)    Visit Information  Last PT Received On: 06/04/13 Assistance Needed: +1 History of Present Illness: pt presents with L4-5 Lami and PLIF.      Subjective Data      Cognition  Cognition Arousal/Alertness: Awake/alert Behavior During Therapy: WFL for tasks assessed/performed Overall Cognitive Status: Within Functional Limits for tasks assessed    Balance     End of Session PT - End of Session Equipment Utilized During Treatment: Gait belt;Back brace Activity Tolerance: Patient tolerated treatment well Patient left: in chair;with call bell/phone within reach Nurse Communication: Mobility status   GP     Fredrich Birks 06/04/2013, 9:07 AM 06/04/2013 Fredrich Birks PTA [phone] pager [phone] office

## 2014-11-23 ENCOUNTER — Emergency Department (HOSPITAL_COMMUNITY)
Admission: EM | Admit: 2014-11-23 | Discharge: 2014-11-23 | Disposition: A | Discharge status: Home / Self Care | Payer: Federal, State, Local not specified - PPO | Attending: Emergency Medicine | Admitting: Emergency Medicine

## 2014-11-23 ENCOUNTER — Encounter (HOSPITAL_COMMUNITY): Payer: Self-pay

## 2014-11-23 ENCOUNTER — Emergency Department (HOSPITAL_COMMUNITY): Payer: Federal, State, Local not specified - PPO

## 2014-11-23 DIAGNOSIS — M1990 Unspecified osteoarthritis, unspecified site: Secondary | ICD-10-CM | POA: Diagnosis not present

## 2014-11-23 DIAGNOSIS — R079 Chest pain, unspecified: Secondary | ICD-10-CM | POA: Diagnosis present

## 2014-11-23 DIAGNOSIS — R0789 Other chest pain: Secondary | ICD-10-CM | POA: Insufficient documentation

## 2014-11-23 DIAGNOSIS — Z79899 Other long term (current) drug therapy: Secondary | ICD-10-CM | POA: Diagnosis not present

## 2014-11-23 LAB — COMPREHENSIVE METABOLIC PANEL
ALK PHOS: 65 U/L (ref 38–126)
ALT: 30 U/L (ref 17–63)
ANION GAP: 11 (ref 5–15)
AST: 25 U/L (ref 15–41)
Albumin: 4.3 g/dL (ref 3.5–5.0)
BILIRUBIN TOTAL: 0.7 mg/dL (ref 0.3–1.2)
BUN: 11 mg/dL (ref 6–20)
CHLORIDE: 104 mmol/L (ref 101–111)
CO2: 23 mmol/L (ref 22–32)
CREATININE: 1.09 mg/dL (ref 0.61–1.24)
Calcium: 9.5 mg/dL (ref 8.9–10.3)
GFR calc non Af Amer: >60 mL/min (ref >60)
GLUCOSE: 112 mg/dL — AB (ref 65–99)
Potassium: 3.7 mmol/L (ref 3.5–5.1)
Sodium: 138 mmol/L (ref 135–145)
Total Protein: 7.6 g/dL (ref 6.5–8.1)

## 2014-11-23 LAB — CBC
HEMATOCRIT: 43.5 % (ref 39.0–52.0)
Hemoglobin: 14.6 g/dL (ref 13.0–17.0)
MCH: 30.4 pg (ref 26.0–34.0)
MCHC: 33.6 g/dL (ref 30.0–36.0)
MCV: 90.4 fL (ref 78.0–100.0)
PLATELETS: 195 10*3/uL (ref 150–400)
RBC: 4.81 MIL/uL (ref 4.22–5.81)
RDW: 13 % (ref 11.5–15.5)
WBC: 8.1 10*3/uL (ref 4.0–10.5)

## 2014-11-23 LAB — I-STAT TROPONIN, ED
Troponin i, poc: 0.03 ng/mL (ref 0.00–0.08)
Troponin i, poc: 0.01 ng/mL (ref 0.00–0.08)

## 2014-11-23 MED ORDER — GI COCKTAIL ~~LOC~~
30.0000 mL | Freq: Once | ORAL | Status: AC
  Administered 2014-11-23: 30 mL via ORAL
  Filled 2014-11-23: qty 30

## 2014-11-23 NOTE — Discharge Instructions (Signed)
Chest Pain (Nonspecific) °It is often hard to give a specific diagnosis for the cause of chest pain. There is always a chance that your pain could be related to something serious, such as a heart attack or a blood clot in the lungs. You need to follow up with your health care provider for further evaluation. °CAUSES  °· Heartburn. °· Pneumonia or bronchitis. °· Anxiety or stress. °· Inflammation around your heart (pericarditis) or lung (pleuritis or pleurisy). °· A blood clot in the lung. °· A collapsed lung (pneumothorax). It can develop suddenly on its own (spontaneous pneumothorax) or from trauma to the chest. °· Shingles infection (herpes zoster virus). °The chest wall is composed of bones, muscles, and cartilage. Any of these can be the source of the pain. °· The bones can be bruised by injury. °· The muscles or cartilage can be strained by coughing or overwork. °· The cartilage can be affected by inflammation and become sore (costochondritis). °DIAGNOSIS  °Lab tests or other studies may be needed to find the cause of your pain. Your health care provider may have you take a test called an ambulatory electrocardiogram (ECG). An ECG records your heartbeat patterns over a 24-hour period. You may also have other tests, such as: °· Transthoracic echocardiogram (TTE). During echocardiography, sound waves are used to evaluate how blood flows through your heart. °· Transesophageal echocardiogram (TEE). °· Cardiac monitoring. This allows your health care provider to monitor your heart rate and rhythm in real time. °· Holter monitor. This is a portable device that records your heartbeat and can help diagnose heart arrhythmias. It allows your health care provider to track your heart activity for several days, if needed. °· Stress tests by exercise or by giving medicine that makes the heart beat faster. °TREATMENT  °· Treatment depends on what may be causing your chest pain. Treatment may include: °¨ Acid blockers for  heartburn. °¨ Anti-inflammatory medicine. °¨ Pain medicine for inflammatory conditions. °¨ Antibiotics if an infection is present. °· You may be advised to change lifestyle habits. This includes stopping smoking and avoiding alcohol, caffeine, and chocolate. °· You may be advised to keep your head raised (elevated) when sleeping. This reduces the chance of acid going backward from your stomach into your esophagus. °Most of the time, nonspecific chest pain will improve within 2-3 days with rest and mild pain medicine.  °HOME CARE INSTRUCTIONS  °· If antibiotics were prescribed, take them as directed. Finish them even if you start to feel better. °· For the next few days, avoid physical activities that bring on chest pain. Continue physical activities as directed. °· Do not use any tobacco products, including cigarettes, chewing tobacco, or electronic cigarettes. °· Avoid drinking alcohol. °· Only take medicine as directed by your health care provider. °· Follow your health care provider's suggestions for further testing if your chest pain does not go away. °· Keep any follow-up appointments you made. If you do not go to an appointment, you could develop lasting (chronic) problems with pain. If there is any problem keeping an appointment, call to reschedule. °SEEK MEDICAL CARE IF:  °· Your chest pain does not go away, even after treatment. °· You have a rash with blisters on your chest. °· You have a fever. °SEEK IMMEDIATE MEDICAL CARE IF:  °· You have increased chest pain or pain that spreads to your arm, neck, jaw, back, or abdomen. °· You have shortness of breath. °· You have an increasing cough, or you cough   up blood. °· You have severe back or abdominal pain. °· You feel nauseous or vomit. °· You have severe weakness. °· You faint. °· You have chills. °This is an emergency. Do not wait to see if the pain will go away. Get medical help at once. Call your local emergency services (911 in [place] Do not drive  yourself to the hospital. °MAKE SURE YOU:  °· Understand these instructions. °· Will watch your condition. °· Will get help right away if you are not doing well or get worse. °Document Released: 03/26/2005 Document Revised: 06/21/2013 Document Reviewed: 01/20/2008 °ExitCare® Patient Information ©2015 [employer]. This information is not intended to replace advice given to you by your health care provider. Make sure you discuss any questions you have with your health care provider. ° ° °Emergency Department Resource Guide °1) Find a Doctor and Pay Out of Pocket °Although you won't have to find out who is covered by your insurance plan, it is a good idea to ask around and get recommendations. You will then need to call the office and see if the doctor you have chosen will accept you as a new patient and what types of options they offer for patients who are self-pay. Some doctors offer discounts or will set up payment plans for their patients who do not have insurance, but you will need to ask so you aren't surprised when you get to your appointment. ° °2) Contact Your Local Health Department °Not all health departments have doctors that can see patients for sick visits, but many do, so it is worth a call to see if yours does. If you don't know where your local health department is, you can check in your phone book. The CDC also has a tool to help you locate your state's health department, and many state websites also have listings of all of their local health departments. ° °3) Find a Walk-in Clinic °If your illness is not likely to be very severe or complicated, you may want to try a walk in clinic. These are popping up all over the country in pharmacies, drugstores, and shopping centers. They're usually staffed by nurse practitioners or physician assistants that have been trained to treat common illnesses and complaints. They're usually fairly quick and inexpensive. However, if you have serious medical issues or  chronic medical problems, these are probably not your best option. ° °No Primary Care Doctor: °- Call [facility] at  832-8000 - they can help you locate a primary care doctor that  accepts your insurance, provides certain services, etc. °- Physician Referral Service- 1-800-533-3463 ° °Chronic Pain Problems: °Organization         Address  Phone   Notes  °[facility]  (336) 297-2271 Patients need to be referred by their primary care doctor.  ° °Medication Assistance: °Organization         Address  Phone   Notes  °[place] [facility] [address] °[place], [place] [address] (336) 641-8030 --Must be a resident of [place] °-- Must have NO insurance coverage whatsoever (no Medicaid/ Medicare, etc.) °-- The pt. MUST have a primary care doctor that directs their care regularly and follows them in the community °  °[facility]  (866) 331-1348   °[employer]  (888) 892-1162   ° °Agencies that provide inexpensive medical care: °Organization         Address  Phone   Notes  °[facility]  (  336) 832-8035   °[facility]    (336) 832-7272   °[facility] [address] °[place], [place] [address] (336) 832-4777   °[facility] [address], °[place] (336) 271-4999   °[facility]    (336) 373-0678   °[facility]    (336) 272-1050   °[facility] ° [address], [place] Phone:  (336) 832-4444, Fax:  (336) 832-4440 Hours of Operation:  9 am - 6 pm, M-F.  Also accepts Medicaid/Medicare and self-pay.  °[facility] ° [address], [place] Phone: (336) 832-3150, Fax: (336) 832-3151. Hours of Operation:  8:30 am - 5:30 pm, M-F.  Also accepts Medicaid and self-pay.  °[facility] [place] [address], [place] Phone: (336) 878-6027   °[facility] [address], [place], [place]  (336)723-1848, Ext. 123 Mondays & Thursdays: 7-9 AM.  First 15 patients are seen on a first come, first serve basis. °  ° °Medicaid-accepting [place] Providers: ° °Organization         Address  Phone   Notes  °[facility] [address], [place] (336) 641-2100 Also accepts self-pay patients.  °[facility] [address], [place] ° (336) 856-9996   °[facility] [address], [place] (336) 288-8857   °[facility] [address] [place] [address], [place] (336) 299-7000   °Veita Bland [address], [place]  ° (336) 373-1557 Only accepts [place] Access Medicaid patients after they have their name applied to their card.  ° °Self-Pay (no insurance) in [place]: ° °Organization         Address  Phone   Notes  °Sickle Cell Patients, [facility] [address], [place] (336) 832-1970   °[facility] [address], [place] (336) 832-4400   °[facility] [place] ° [address], [place] (336) 992-4800   °[facility]/Dr. Osei-Bonsu ° [address] [place] [address], [place] or [address], [place] (336) 841-8500 Phone number for both [place] and [place] locations is the same.  °[facility] [address], [place] (336) 299-0000   °[facility] [address] [place] [address], [place] or [address] (336) 852-7530 °(336) 878-2260   °[facility] [address], [place] (336) 350-1642, phone; (336) 294-5005, fax Sees patients 1st and 3rd Saturday of every month.  Must not qualify for public or private insurance (i.e. Medicaid, Medicare, [place] Health Choice, Veterans' Benefits) • Household income should be no more than 200% of the poverty level •The clinic cannot treat you if you are pregnant or think you are pregnant • Sexually transmitted  diseases are not treated at the clinic.  ° ° °Dental Care: °Organization         Address  Phone  Notes  °[facility] [address],  (336) 641-6152 Accepts children up to age 21 who are enrolled in Medicaid or [place] Health Choice; pregnant women with a Medicaid card; and children who have applied for Medicaid or [place] Health Choice, but were declined, whose parents can pay a reduced fee at time of service.  °[facility] [place]    [address], [place] (336) 641-7733 Accepts children up to age 21 who are enrolled in Medicaid or [place] Health Choice; pregnant women with a Medicaid card; and children who have applied for Medicaid or [place] Health Choice, but were declined, whose parents can pay a reduced fee at time of service.  °[facility] ° [address], [place] (336) 641-4533 Patients are seen by appointment only. Walk-ins are not accepted. [facility] will see patients 18 years of age and older. °Monday - Tuesday (8am-5pm) °Most Wednesdays (8:30-5pm) °$30 per visit, cash only  °[facility] ° [address], [place] (336) 641-4533 Patients are seen by appointment only. Walk-ins are not accepted. [facility] will see patients 18 years of age and older. °One Wednesday Evening (Monthly: Volunteer Based).  $30 per visit, cash only  °[facility]  (919) 537-3737 for adults; Children under age 4, call Graduate Pediatric Dentistry at (919) [phone]. Children aged 4-14, please call (919) [phone] to request a pediatric application. ° Dental services are provided in all areas of dental care including fillings, crowns and bridges, complete and partial dentures, implants, gum treatment, root canals, and extractions. Preventive care is also provided. Treatment is provided to both adults and children. °Patients are selected via a  lottery and there is often a waiting list. °  °[facility] [address], °[place] ° (336) 763-8833 [email or web address] °  °[facility] [address], [place], [place] (336)723-1848, Ext. 123 Second and Fourth Thursday of each month, opens at 6:30 AM; Clinic ends at 9 AM.  Patients are seen on a first-come first-served basis, and a limited number are seen during each clinic.  ° °[facility] ° [address], [place], [place] (336) 723-7904   Eligibility Requirements °You must have lived in [place], [place], or [place] counties for at least the last three months. °  You cannot be eligible for state or federal sponsored healthcare insurance, including Veterans Administration, Medicaid, or Medicare. °  You generally cannot be eligible for healthcare insurance through your employer.  °  How to apply: °Eligibility screenings are held every Tuesday and Wednesday afternoon from 1:00 pm until 4:00 pm. You do not need an appointment for the interview!  °[facility] [address], [place], [place] 336-631-2330   °[facility]  336-342-8273   °[facility]  336-703-3100   °[facility]  336-570-6415   ° °Behavioral Health Resources in the Community: °Intensive Outpatient Programs °Organization         Address  Phone  Notes  °[facility] [address], [place], [place] 336-878-6098   °[facility] [address], [place], [place] 336-832-9800   °[facility] [address], [place], [place] ° 336-882-2125   °[facility] [address],  °, [place] 1-800-853-5163 or 336-641-4981   °Substance Abuse Resources °Organization         Address  Phone  Notes  °[facility]  336-882-2125   °[facility]  336-784-9470   °[facility]  336-285-9073   °[facility]  336-845-3988   °Residential &  Outpatient Substance Abuse Program  1-800-659-3381   °Psychological Services °Organization         Address  Phone  Notes  °[facility]  336- 832-9600   °  [facility]  336- 378-7881   °[facility] [address], [place] 1-800-853-5163 or 336-641-4981   ° °Mobile Crisis Teams °Organization         Address  Phone  Notes  °[facility], Mobile Crisis Care Unit  1-877-626-1772   °Assertive °[facility] ° [address] [place], [place] 336-834-9664   °Sharon DeEsch [address] °[place] 336-554-5454   ° °Self-Help/Support Groups °Organization         Address  Phone             Notes  °[facility] [place] - variety of support groups  336- 373-1402 Call for more information  °Narcotics Anonymous (NA), [facility] [address], °[place]  2 meetings at this location  ° °Residential Treatment Programs °Organization         Address  Phone  Notes  °[facility] [address],    °[place]  1-866-801-8205   °[facility] ° [address], [place], [place] 704-293-8524   °[facility] [address], [place] 336-845-3988 Admissions: 8am-3pm M-F  °[facility] [address]    °[place], [place] 336-841-1104   °[facility] [address], [place], [place] 336-379-7146   °[facility] [address]  °[place], [place] 336-285-9073   °[facility] - Intensive Outpatient [address], [place], [place] 336-852-3033   °[facility] [address]  °[place], [place] 1-877-615-2722 or 336-784-9470   °[facility] ([facility] [address] , [place] 336-227-7417 Accepts Medicaid  °[facility] [address]  °[place] 1-800-659-3381 Substance Abuse/Addiction Treatment  ° °[place] Behavioral Health Resources °Organization          Address  Phone  Notes  °[facility]  (888) 581-9988   °Julie Brannon, PhD [address] [place], [place]   (336) 349-5553 or (336) 951-0000   °[facility]   [address] °[place], [place] (336) 349-4454   °[facility] [address], [place], [place] (336) 342-8316 Insurance/Medicaid/sponsorship through [facility]  °[facility] [address]                                    [place], [place] (336) 342-8316 Therapy/tele-psych/case  °[facility] [address]  ° [place], [place] (336) 349-2233    °Dr. Arfeen  (336) 349-4544   °[facility] [place]  [employer] [place] [facility] 1) [address], [place] °2) [address], [place] °3)  [address], [place] (336) 349-3220 °(336) 342-7768 ° °(336) 342-8140   °[place] [facility] (336) 342-1394 or (336) 342-3537 (After Hours)    ° ° ° °

## 2014-11-23 NOTE — ED Notes (Signed)
Pt reports CP x several months, worse this morning - felt "tight and would not go away." Reports shortness of breath and dizziness when this occurred. Never been assessed previously for CP.

## 2014-11-23 NOTE — ED Provider Notes (Signed)
CSN: [id number]     Arrival date & time 11/23/14  5462 History   First MD Initiated Contact with Patient 11/23/14 0719     Chief Complaint  Patient presents with  . Chest Pain     (Consider location/radiation/quality/duration/timing/severity/associated sxs/prior Treatment) HPI   54 year old male who presents for evaluation of chest pain. Patient reports for the past 3-4 months he has had recurrent chest pain. He described pain as a tightness and aching sensation to his mid chest with occasional sharp pain that radiates across the chest. It has increased in frequency from once every month to 3-4 times every week with the most recent episode this morning at 5 AM working up from sleep. Pain usually lasting for less than a minute but this morning it lasted for nearly 20 minutes. He endorses occasional nonproductive cough. Episodes of lightheadedness and dizziness with this discomfort. He tries stretching with some improvement. He denies having any fever, chills, URI symptoms, abdominal pain, nausea vomiting diarrhea, worsening back pain from his usual chronic back pain, numbness weakness of rash. Pain is nonexertional. Does not correlate pain with eating. He is a nonsmoker. No significant family history of cardiac disease. No history of diabetes or hyperlipidemia. His pain is minimal at this time. No prior history of PE or DVT.  Past Medical History  Diagnosis Date  . Back pain     scheduled for surgery  01/2012  . Broken toe     broke last week  . Headache(784.0)   . Arthritis    Past Surgical History  Procedure Laterality Date  . Back surgery  99  . Lumbar laminectomy/decompression microdiscectomy  02/23/2012    Procedure: LUMBAR LAMINECTOMY/DECOMPRESSION MICRODISCECTOMY 2 LEVELS;  Surgeon: Ophelia Charter, MD;  Location: [facility];  Service: Neurosurgery;  Laterality: Bilateral;  Lumbar three-four, four-five Laminectomy with evacuation of epidural lipomatosis   History reviewed. No  pertinent family history. History  Substance Use Topics  . Smoking status: Never Smoker   . Smokeless tobacco: Not on file  . Alcohol Use: Yes     Comment: occ    Review of Systems  All other systems reviewed and are negative.     Allergies  Review of patient's allergies indicates no known allergies.  Home Medications   Prior to Admission medications   Medication Sig Start Date End Date Taking? Authorizing Provider  diazepam (VALIUM) 5 MG tablet Take 1 tablet (5 mg total) by mouth every 6 (six) hours as needed for muscle spasms. 06/03/13   Newman Pies, MD  docusate sodium 100 MG CAPS Take 100 mg by mouth 2 (two) times daily. 06/03/13   Newman Pies, MD  gabapentin (NEURONTIN) 600 MG tablet Take 1 tablet (600 mg total) by mouth 3 (three) times daily. 06/03/13   Newman Pies, MD  oxyCODONE-acetaminophen (PERCOCET) 10-325 MG per tablet Take 1 tablet by mouth every 4 (four) hours as needed for pain. 06/03/13   Newman Pies, MD   BP 150/104 mmHg  Pulse 95  Temp(Src) 98.1 F (36.7 C) (Oral)  Resp 29  SpO2 94% Physical Exam  Constitutional: He appears well-developed and well-nourished. No distress.  Awake, alert, nontoxic appearance  HENT:  Head: Atraumatic.  Eyes: Conjunctivae are normal. Right eye exhibits no discharge. Left eye exhibits no discharge.  Neck: Normal range of motion. Neck supple.  Cardiovascular: Normal rate, regular rhythm and intact distal pulses.   Pulmonary/Chest: Effort normal. No respiratory distress. He exhibits tenderness (Mild midsternal tenderness to palpation without crepitus  or emphysema. No overlying skin changes.).  Abdominal: Soft. There is no tenderness. There is no rebound.  Musculoskeletal: He exhibits no tenderness.  ROM appears intact, no obvious focal weakness  Neurological: He is alert.  Skin: Skin is warm and dry. No rash noted.  Psychiatric: He has a normal mood and affect.  Nursing note and vitals reviewed.   ED Course   Procedures (including critical care time)  Patient with recurrent midsternal chest discomfort which is reproducible on exam. Heart score is 1, low risk of MACE.  PERC negative.    12:06 PM Pain is mostly resolved. Patient resting comfortably. Negative delta troponin. Labs are reassuring, no electrolytes abnormalities. Chest x-ray shows no acute finding.  Recommend f/u with PCP for further evaluation.  Return precaution given.    Labs Review Labs Reviewed  COMPREHENSIVE METABOLIC PANEL - Abnormal; Notable for the following:    Glucose, Bld 112 (*)    All other components within normal limits  CBC  I-STAT TROPOININ, ED  Randolm Idol, ED    Imaging Review Dg Chest 2 View  11/23/2014   CLINICAL DATA:  Chest pain for several months  EXAM: CHEST  2 VIEW  COMPARISON:  07/05/2007  FINDINGS: The heart size and mediastinal contours are within normal limits. Both lungs are clear. The visualized skeletal structures are unremarkable.  IMPRESSION: No active cardiopulmonary disease.   Electronically Signed   By: Inez Catalina M.D.   On: 11/23/2014 07:36     EKG Interpretation   Date/Time:  Thursday Nov 23 2014 07:00:47 EDT Ventricular Rate:  95 PR Interval:  153 QRS Duration: 89 QT Interval:  366 QTC Calculation: 460 R Axis:   -40 Text Interpretation:  Sinus rhythm Left axis deviation Confirmed by Glynn Octave [phone]) on 11/23/2014 7:08:58 AM      MDM   Final diagnoses:  Other chest pain    BP 149/100 mmHg  Pulse 82  Temp(Src) 98.1 F (36.7 C) (Oral)  Resp 16  SpO2 94%  I have reviewed nursing notes and vital signs. I personally reviewed the imaging tests through PACS system  I reviewed available ER/hospitalization records thought the EMR     Domenic Moras, PA-C 11/23/14 [place], MD 11/23/14 1213

## 2015-11-17 IMAGING — RF DG C-ARM 61-120 MIN
1 series · 2 of 2 positions shown · non-contrast
Comparison: MRI 08/09/2012

CLINICAL DATA: L4-5 posterior fixation.

EXAM:
DG C-ARM 1-60 MIN; LUMBAR SPINE - 2-3 VIEW

[Series 1: run · 2 of 2 slices shown]
[im 1/2]
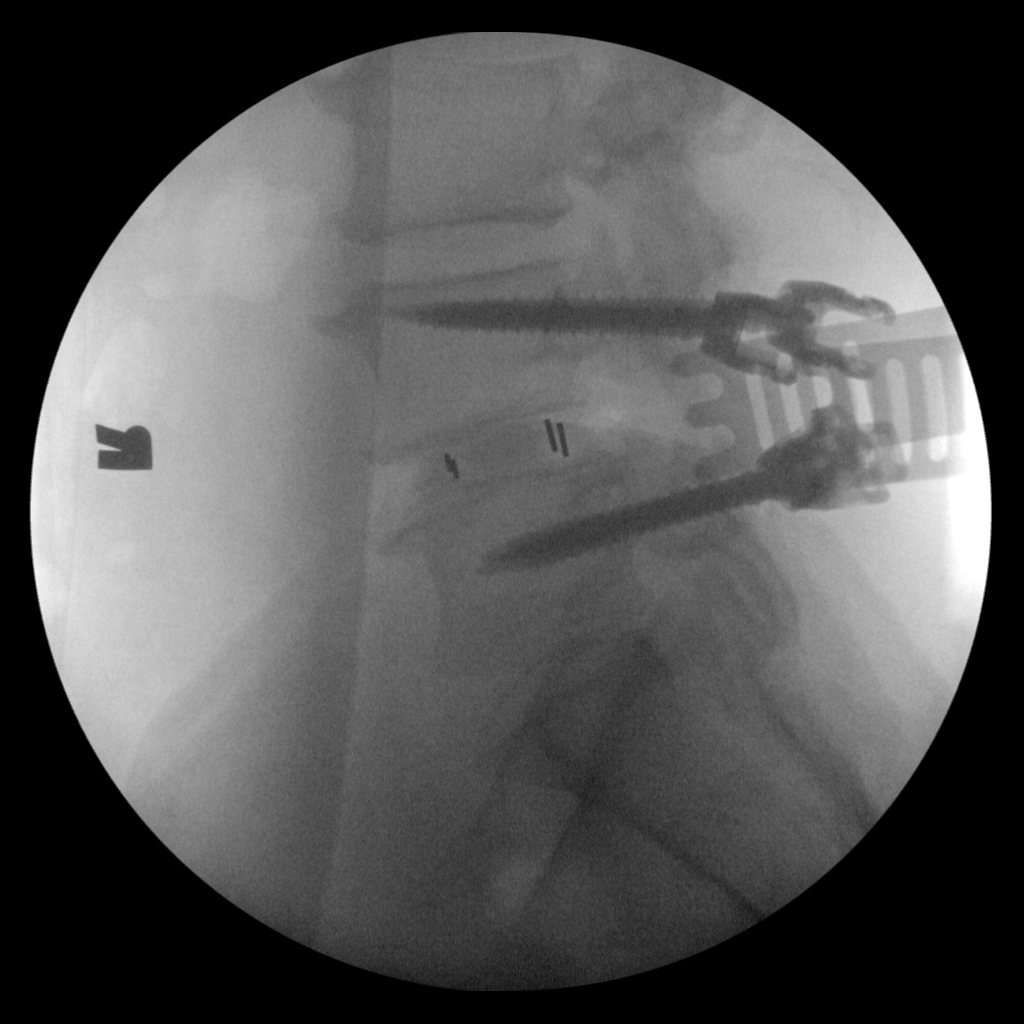
[im 2/2]
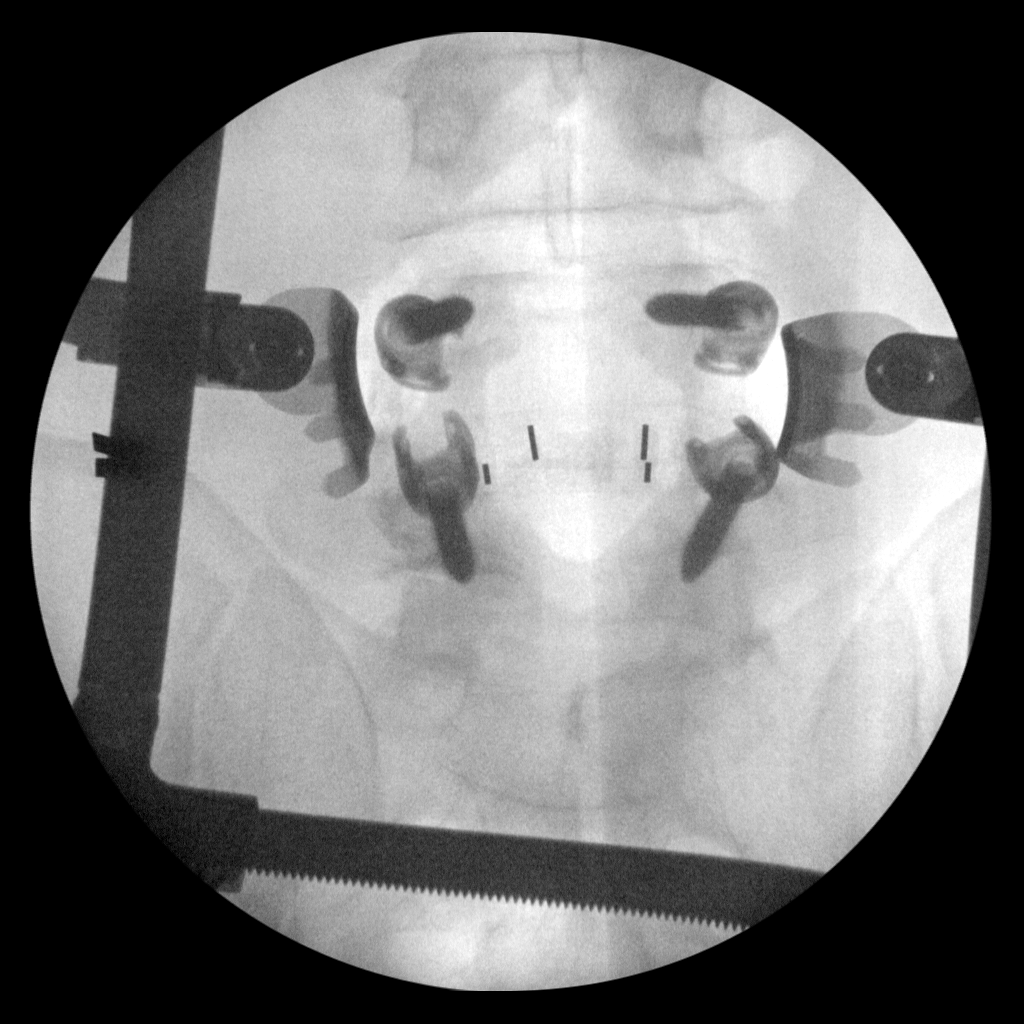

[2 of 2 positions shown; findings below may reference images not displayed]

FINDINGS: AP and lateral views. These demonstrate trans pedicle screw fixation
at the L4-5 level. No acute hardware complication.
IMPRESSION: Intraoperative imaging of L4-5 fixation.

## 2017-03-24 DIAGNOSIS — Z6832 Body mass index (BMI) 32.0-32.9, adult: Secondary | ICD-10-CM | POA: Diagnosis not present

## 2017-03-24 DIAGNOSIS — M545 Low back pain: Secondary | ICD-10-CM | POA: Diagnosis not present

## 2017-03-24 DIAGNOSIS — R030 Elevated blood-pressure reading, without diagnosis of hypertension: Secondary | ICD-10-CM | POA: Diagnosis not present

## 2017-03-24 DIAGNOSIS — M791 Myalgia: Secondary | ICD-10-CM | POA: Diagnosis not present

## 2017-07-21 DIAGNOSIS — Z6832 Body mass index (BMI) 32.0-32.9, adult: Secondary | ICD-10-CM | POA: Diagnosis not present

## 2017-07-21 DIAGNOSIS — M545 Low back pain: Secondary | ICD-10-CM | POA: Diagnosis not present

## 2017-07-21 DIAGNOSIS — R030 Elevated blood-pressure reading, without diagnosis of hypertension: Secondary | ICD-10-CM | POA: Diagnosis not present

## 2018-04-06 DIAGNOSIS — I10 Essential (primary) hypertension: Secondary | ICD-10-CM | POA: Diagnosis not present

## 2018-04-06 DIAGNOSIS — Z0000 Encounter for general adult medical examination without abnormal findings: Secondary | ICD-10-CM | POA: Diagnosis not present

## 2018-04-06 DIAGNOSIS — Z23 Encounter for immunization: Secondary | ICD-10-CM | POA: Diagnosis not present

## 2018-04-06 DIAGNOSIS — Z125 Encounter for screening for malignant neoplasm of prostate: Secondary | ICD-10-CM | POA: Diagnosis not present

## 2018-07-21 DIAGNOSIS — K621 Rectal polyp: Secondary | ICD-10-CM | POA: Diagnosis not present

## 2018-07-21 DIAGNOSIS — Z1211 Encounter for screening for malignant neoplasm of colon: Secondary | ICD-10-CM | POA: Diagnosis not present

## 2018-07-21 DIAGNOSIS — K6389 Other specified diseases of intestine: Secondary | ICD-10-CM | POA: Diagnosis not present

## 2018-07-21 DIAGNOSIS — D120 Benign neoplasm of cecum: Secondary | ICD-10-CM | POA: Diagnosis not present

## 2018-07-21 DIAGNOSIS — K640 First degree hemorrhoids: Secondary | ICD-10-CM | POA: Diagnosis not present

## 2018-07-21 DIAGNOSIS — D123 Benign neoplasm of transverse colon: Secondary | ICD-10-CM | POA: Diagnosis not present

## 2018-07-23 DIAGNOSIS — D123 Benign neoplasm of transverse colon: Secondary | ICD-10-CM | POA: Diagnosis not present

## 2018-07-23 DIAGNOSIS — D120 Benign neoplasm of cecum: Secondary | ICD-10-CM | POA: Diagnosis not present

## 2018-08-06 DIAGNOSIS — R030 Elevated blood-pressure reading, without diagnosis of hypertension: Secondary | ICD-10-CM | POA: Diagnosis not present

## 2018-08-06 DIAGNOSIS — R51 Headache: Secondary | ICD-10-CM | POA: Diagnosis not present

## 2019-04-28 DIAGNOSIS — Z13220 Encounter for screening for lipoid disorders: Secondary | ICD-10-CM | POA: Diagnosis not present

## 2019-04-28 DIAGNOSIS — Z125 Encounter for screening for malignant neoplasm of prostate: Secondary | ICD-10-CM | POA: Diagnosis not present

## 2019-04-28 DIAGNOSIS — N529 Male erectile dysfunction, unspecified: Secondary | ICD-10-CM | POA: Diagnosis not present

## 2019-04-28 DIAGNOSIS — Z0000 Encounter for general adult medical examination without abnormal findings: Secondary | ICD-10-CM | POA: Diagnosis not present

## 2019-04-28 DIAGNOSIS — I10 Essential (primary) hypertension: Secondary | ICD-10-CM | POA: Diagnosis not present

## 2019-05-25 DIAGNOSIS — I10 Essential (primary) hypertension: Secondary | ICD-10-CM | POA: Diagnosis not present

## 2021-01-23 DIAGNOSIS — M1711 Unilateral primary osteoarthritis, right knee: Secondary | ICD-10-CM | POA: Diagnosis not present

## 2021-02-06 DIAGNOSIS — U071 COVID-19: Secondary | ICD-10-CM | POA: Diagnosis not present

## 2021-02-06 DIAGNOSIS — Z20822 Contact with and (suspected) exposure to covid-19: Secondary | ICD-10-CM | POA: Diagnosis not present

## 2021-03-12 DIAGNOSIS — Z125 Encounter for screening for malignant neoplasm of prostate: Secondary | ICD-10-CM | POA: Diagnosis not present

## 2021-03-12 DIAGNOSIS — R350 Frequency of micturition: Secondary | ICD-10-CM | POA: Diagnosis not present

## 2021-03-12 DIAGNOSIS — I10 Essential (primary) hypertension: Secondary | ICD-10-CM | POA: Diagnosis not present

## 2021-07-09 DIAGNOSIS — Z23 Encounter for immunization: Secondary | ICD-10-CM | POA: Diagnosis not present

## 2021-07-09 DIAGNOSIS — R350 Frequency of micturition: Secondary | ICD-10-CM | POA: Diagnosis not present

## 2021-07-09 DIAGNOSIS — I10 Essential (primary) hypertension: Secondary | ICD-10-CM | POA: Diagnosis not present

## 2021-07-09 DIAGNOSIS — Z0000 Encounter for general adult medical examination without abnormal findings: Secondary | ICD-10-CM | POA: Diagnosis not present

## 2022-01-27 ENCOUNTER — Encounter (HOSPITAL_COMMUNITY): Payer: Self-pay

## 2022-01-27 ENCOUNTER — Ambulatory Visit (HOSPITAL_COMMUNITY)
Admission: EM | Admit: 2022-01-27 | Discharge: 2022-01-27 | Disposition: A | Discharge status: Home / Self Care | Payer: Federal, State, Local not specified - PPO | Attending: Nurse Practitioner | Admitting: Nurse Practitioner

## 2022-01-27 DIAGNOSIS — R1032 Left lower quadrant pain: Secondary | ICD-10-CM | POA: Insufficient documentation

## 2022-01-27 LAB — COMPREHENSIVE METABOLIC PANEL
ALT: 17 U/L (ref 0–44)
AST: 21 U/L (ref 15–41)
Albumin: 3.8 g/dL (ref 3.5–5.0)
Alkaline Phosphatase: 66 U/L (ref 38–126)
Anion gap: 6 (ref 5–15)
BUN: 11 mg/dL (ref 8–23)
CO2: 26 mmol/L (ref 22–32)
Calcium: 9.5 mg/dL (ref 8.9–10.3)
Chloride: 105 mmol/L (ref 98–111)
Creatinine, Ser: 0.98 mg/dL (ref 0.61–1.24)
GFR, Estimated: >60 mL/min (ref >60)
Glucose, Bld: 101 mg/dL — ABNORMAL HIGH (ref 70–99)
Potassium: 4 mmol/L (ref 3.5–5.1)
Sodium: 137 mmol/L (ref 135–145)
Total Bilirubin: 1.2 mg/dL (ref 0.3–1.2)
Total Protein: 7.5 g/dL (ref 6.5–8.1)

## 2022-01-27 LAB — CBC WITH DIFFERENTIAL/PLATELET
Abs Immature Granulocytes: 0.03 10*3/uL (ref 0.00–0.07)
Basophils Absolute: 0 10*3/uL (ref 0.0–0.1)
Basophils Relative: 0 %
Eosinophils Absolute: 0.1 10*3/uL (ref 0.0–0.5)
Eosinophils Relative: 1 %
HCT: 44 % (ref 39.0–52.0)
Hemoglobin: 14.8 g/dL (ref 13.0–17.0)
Immature Granulocytes: 0 %
Lymphocytes Relative: 11 %
Lymphs Abs: 1.2 10*3/uL (ref 0.7–4.0)
MCH: 31 pg (ref 26.0–34.0)
MCHC: 33.6 g/dL (ref 30.0–36.0)
MCV: 92.2 fL (ref 80.0–100.0)
Monocytes Absolute: 0.9 10*3/uL (ref 0.1–1.0)
Monocytes Relative: 8 %
Neutro Abs: 8.9 10*3/uL — ABNORMAL HIGH (ref 1.7–7.7)
Neutrophils Relative %: 80 %
Platelets: 220 10*3/uL (ref 150–400)
RBC: 4.77 MIL/uL (ref 4.22–5.81)
RDW: 12.7 % (ref 11.5–15.5)
WBC: 11.2 10*3/uL — ABNORMAL HIGH (ref 4.0–10.5)
nRBC: 0 % (ref 0.0–0.2)

## 2022-01-27 LAB — POCT URINALYSIS DIPSTICK, ED / UC
Glucose, UA: NEGATIVE mg/dL
Hgb urine dipstick: NEGATIVE
Ketones, ur: NEGATIVE mg/dL
Leukocytes,Ua: NEGATIVE
Nitrite: NEGATIVE
Protein, ur: 30 mg/dL — AB
Specific Gravity, Urine: 1.025 (ref 1.005–1.030)
Urobilinogen, UA: 1 mg/dL (ref 0.0–1.0)
pH: 5.5 (ref 5.0–8.0)

## 2022-01-27 LAB — LIPASE, BLOOD: Lipase: 26 U/L (ref 11–51)

## 2022-01-27 MED ORDER — KETOROLAC TROMETHAMINE 60 MG/2ML IM SOLN
60.0000 mg | Freq: Once | INTRAMUSCULAR | Status: AC
  Administered 2022-01-27: 60 mg via INTRAMUSCULAR

## 2022-01-27 MED ORDER — KETOROLAC TROMETHAMINE 10 MG PO TABS: 10.0000 mg | ORAL_TABLET | Freq: Four times a day (QID) | ORAL | 0 refills | Status: AC | PRN

## 2022-01-27 MED ORDER — CIPROFLOXACIN HCL 500 MG PO TABS: 500.0000 mg | ORAL_TABLET | Freq: Two times a day (BID) | ORAL | 0 refills | Status: AC

## 2022-01-27 MED ORDER — KETOROLAC TROMETHAMINE 60 MG/2ML IM SOLN
INTRAMUSCULAR | Status: AC
  Filled 2022-01-27: qty 2

## 2022-01-27 NOTE — ED Provider Notes (Signed)
[facility]    CSN: [id number] Arrival date & time: 01/27/22  0801      History   Chief Complaint Chief Complaint  Patient presents with   Flank Pain    HPI Rodney Wood is a 61 y.o. male.   Subjective:   Rodney Wood is a 61 y.o. male who presents for evaluation of abdominal pain. Onset was a couple months ago. Pain was intermittent but more constant over the past few days. Symptoms have been stable. The pain is described as aching, cramping, and pressure-like, and is 9/10 in intensity. Pain is located in the LLQ without radiation.  Aggravating factors: movement.  Alleviating factors: none. The patient denies anorexia, diarrhea, dysuria, fever, frequency, headache, hematochezia, hematuria, melena, nausea, or vomiting.  Review of Systems  Pertinent items noted in HPI and remainder of comprehensive ROS otherwise negative.         Past Medical History:  Diagnosis Date   Arthritis    Back pain    scheduled for surgery  01/2012   Broken toe    broke last week   Headache(784.0)     Patient Active Problem List   Diagnosis Date Noted   Spondylolisthesis of lumbar region 05/30/2013    Past Surgical History:  Procedure Laterality Date   BACK SURGERY  99   LUMBAR LAMINECTOMY/DECOMPRESSION MICRODISCECTOMY  02/23/2012   Procedure: LUMBAR LAMINECTOMY/DECOMPRESSION MICRODISCECTOMY 2 LEVELS;  Surgeon: Ophelia Charter, MD;  Location: [facility];  Service: Neurosurgery;  Laterality: Bilateral;  Lumbar three-four, four-five Laminectomy with evacuation of epidural lipomatosis       Home Medications    Prior to Admission medications   Medication Sig Start Date End Date Taking? Authorizing Provider  ciprofloxacin (CIPRO) 500 MG tablet Take 1 tablet (500 mg total) by mouth 2 (two) times daily. 01/27/22  Yes Enrique Sack, FNP  ketorolac (TORADOL) 10 MG tablet Take 1 tablet (10 mg total) by mouth every 6 (six) hours as needed. 01/27/22  Yes Enrique Sack,  FNP    Family History History reviewed. No pertinent family history.  Social History Social History   Tobacco Use   Smoking status: Never  Substance Use Topics   Alcohol use: Yes    Comment: occ   Drug use: No     Allergies   Patient has no known allergies.   Review of Systems Review of Systems  Constitutional:  Negative for appetite change, fatigue and fever.  Respiratory:  Negative for cough.   Cardiovascular:  Negative for chest pain.  Gastrointestinal:  Positive for abdominal pain. Negative for abdominal distention, blood in stool, constipation, diarrhea, nausea and vomiting.  Genitourinary:  Negative for dysuria, flank pain, frequency, hematuria and urgency.  Musculoskeletal:  Negative for back pain.  All other systems reviewed and are negative.    Physical Exam Triage Vital Signs ED Triage Vitals  Enc Vitals Group     BP 01/27/22 0810 (!) 145/93     Pulse Rate 01/27/22 0810 85     Resp 01/27/22 0810 18     Temp 01/27/22 0810 98.3 F (36.8 C)     Temp Source 01/27/22 0810 Oral     SpO2 01/27/22 0810 97 %     Weight --      Height --      Head Circumference --      Peak Flow --      Pain Score 01/27/22 0811 7     Pain Loc --  Pain Edu? --      Excl. in [place]? --    No data found.  Updated Vital Signs BP (!) 145/93 (BP Location: Left Arm)   Pulse 85   Temp 98.3 F (36.8 C) (Oral)   Resp 18   SpO2 97%   Visual Acuity Right Eye Distance:   Left Eye Distance:   Bilateral Distance:    Right Eye Near:   Left Eye Near:    Bilateral Near:     Physical Exam Vitals and nursing note reviewed.  Constitutional:      General: He is not in acute distress.    Appearance: Normal appearance. He is not ill-appearing, toxic-appearing or diaphoretic.  HENT:     Head: Normocephalic.     Nose: Nose normal.  Eyes:     Conjunctiva/sclera: Conjunctivae normal.  Cardiovascular:     Rate and Rhythm: Normal rate and regular rhythm.     Heart sounds: Normal  heart sounds.  Pulmonary:     Effort: Pulmonary effort is normal.     Breath sounds: Normal breath sounds.  Abdominal:     General: Bowel sounds are normal. There is no distension.     Palpations: Abdomen is soft.     Tenderness: There is abdominal tenderness in the left lower quadrant. There is no right CVA tenderness, left CVA tenderness, guarding or rebound.     Hernia: No hernia is present.  Musculoskeletal:        General: Normal range of motion.     Cervical back: Normal range of motion and neck supple.  Skin:    General: Skin is warm and dry.  Neurological:     General: No focal deficit present.     Mental Status: He is alert and oriented to person, place, and time.  Psychiatric:        Mood and Affect: Mood normal.        Behavior: Behavior normal.      UC Treatments / Results  Labs (all labs ordered are listed, but only abnormal results are displayed) Labs Reviewed  POCT URINALYSIS DIPSTICK, ED / UC - Abnormal; Notable for the following components:      Result Value   Bilirubin Urine SMALL (*)    Protein, ur 30 (*)    All other components within normal limits  COMPREHENSIVE METABOLIC PANEL  CBC WITH DIFFERENTIAL/PLATELET  LIPASE, BLOOD    EKG   Radiology No results found.  Procedures Procedures (including critical care time)  Medications Ordered in UC Medications  ketorolac (TORADOL) injection 60 mg (60 mg Intramuscular Given 01/27/22 0850)    Initial Impression / Assessment and Plan / UC Course  I have reviewed the triage vital signs and the nursing notes.  Pertinent labs & imaging results that were available during my care of the patient were reviewed by me and considered in my medical decision making (see chart for details).    61 yo male presenting with intermittent LLQ abdominal pain for the past couple of months which has been worse over the past few days. He denies any anorexia, diarrhea, dysuria, fever, frequency, headache, hematochezia,  hematuria, melena, nausea, or vomiting. UA relatively unremarkable. Patient afebrile and nontoxic appearing. VSS. CBC, CMP and lipase pending. Toradol injection given in clinic. Patient discharged home on Cipro and Toradol. Drink plenty of fluids. Diet as tolerated. Follow-up with PCP if no improvement in symptoms. Go to ED if worse.   Today's evaluation has revealed no signs of a  dangerous process. Discussed diagnosis with patient and/or guardian. Patient and/or guardian aware of their diagnosis, possible red flag symptoms to watch out for and need for close follow up. Patient and/or guardian understands verbal and written discharge instructions. Patient and/or guardian comfortable with plan and disposition.  Patient and/or guardian has a clear mental status at this time, good insight into illness (after discussion and teaching) and has clear judgment to make decisions regarding their care  Documentation was completed with the aid of voice recognition software. Transcription may contain typographical errors. Final Clinical Impressions(s) / UC Diagnoses   Final diagnoses:  Left lower quadrant abdominal pain     Discharge Instructions      Take medications as prescribed  Drink plenty of fluids  Monitor your symptoms closely  You will be notified only if any of your blood work is abnormal. You may check your MyChart to view the results.  Go to the ED immediately if your pain gets worse or you begin to experience any of the symptoms we discussed today  Follow-up with GI or your primary care provider if your pain doesn't improve       ED Prescriptions     Medication Sig Dispense Auth. Provider   ciprofloxacin (CIPRO) 500 MG tablet Take 1 tablet (500 mg total) by mouth 2 (two) times daily. 14 tablet Enrique Sack, FNP   ketorolac (TORADOL) 10 MG tablet Take 1 tablet (10 mg total) by mouth every 6 (six) hours as needed. 20 tablet Enrique Sack, FNP      PDMP not reviewed this  encounter.   Orlin Hilding [place], [place] 01/27/22 [phone]

## 2022-01-27 NOTE — Discharge Instructions (Addendum)
Take medications as prescribed  Drink plenty of fluids  Monitor your symptoms closely  You will be notified only if any of your blood work is abnormal. You may check your MyChart to view the results.  Go to the ED immediately if your pain gets worse or you begin to experience any of the symptoms we discussed today  Follow-up with GI or your primary care provider if your pain doesn't improve

## 2022-01-27 NOTE — ED Triage Notes (Signed)
Pt presents to the office for right side pain x 1 month. Pt reports bowel movement are normal. Pt reports no urinary pain or frequency.

## 2023-02-23 DIAGNOSIS — I10 Essential (primary) hypertension: Secondary | ICD-10-CM | POA: Diagnosis not present

## 2023-02-23 DIAGNOSIS — Z0000 Encounter for general adult medical examination without abnormal findings: Secondary | ICD-10-CM | POA: Diagnosis not present

## 2023-02-23 DIAGNOSIS — Z125 Encounter for screening for malignant neoplasm of prostate: Secondary | ICD-10-CM | POA: Diagnosis not present

## 2023-02-23 DIAGNOSIS — Z13220 Encounter for screening for lipoid disorders: Secondary | ICD-10-CM | POA: Diagnosis not present
